# Patient Record
Sex: Female | Born: 1970 | Race: Black or African American | Hispanic: No | Marital: Married | Smoking: Never smoker
Health system: Southern US, Community
[De-identification: ages and names within clinical notes are randomized; demographics above are authoritative.]

## PROBLEM LIST (undated history)

## (undated) DIAGNOSIS — I10 Essential (primary) hypertension: Secondary | ICD-10-CM

## (undated) DIAGNOSIS — E7800 Pure hypercholesterolemia, unspecified: Secondary | ICD-10-CM

## (undated) HISTORY — PX: TOTAL ABDOMINAL HYSTERECTOMY: SHX209

## (undated) HISTORY — PX: ABDOMINAL HYSTERECTOMY: SHX81

---

## 2009-09-06 ENCOUNTER — Other Ambulatory Visit: Admission: RE | Admit: 2009-09-06 | Discharge: 2009-09-06 | Payer: Self-pay | Admitting: Obstetrics and Gynecology

## 2009-09-06 ENCOUNTER — Ambulatory Visit: Payer: Self-pay | Admitting: Obstetrics and Gynecology

## 2009-09-06 LAB — CONVERTED CEMR LAB
HCT: 27.8 % — ABNORMAL LOW
Hemoglobin: 8.2 g/dL — ABNORMAL LOW
MCHC: 29.5 g/dL — ABNORMAL LOW
MCV: 73.5 fL — ABNORMAL LOW
Pap Smear: NEGATIVE
Platelets: 361 K/uL
RBC: 3.78 M/uL — ABNORMAL LOW
RDW: 15.5 %
WBC: 4.9 10*3/microliter

## 2009-09-28 ENCOUNTER — Ambulatory Visit: Payer: Self-pay | Admitting: Obstetrics and Gynecology

## 2009-11-10 ENCOUNTER — Inpatient Hospital Stay (HOSPITAL_COMMUNITY): Admission: AD | Admit: 2009-11-10 | Discharge: 2009-11-10 | Payer: Self-pay | Admitting: Obstetrics & Gynecology

## 2009-11-10 ENCOUNTER — Ambulatory Visit: Payer: Self-pay | Admitting: Gynecology

## 2009-11-17 ENCOUNTER — Ambulatory Visit: Payer: Self-pay | Admitting: Obstetrics & Gynecology

## 2010-01-08 ENCOUNTER — Encounter: Payer: Self-pay | Admitting: Obstetrics & Gynecology

## 2010-01-08 ENCOUNTER — Ambulatory Visit: Payer: Self-pay | Admitting: Obstetrics & Gynecology

## 2010-01-08 ENCOUNTER — Inpatient Hospital Stay (HOSPITAL_COMMUNITY): Admission: RE | Admit: 2010-01-08 | Discharge: 2010-01-10 | Payer: Self-pay | Admitting: Obstetrics & Gynecology

## 2010-02-08 ENCOUNTER — Ambulatory Visit: Payer: Self-pay | Admitting: Obstetrics & Gynecology

## 2010-03-15 ENCOUNTER — Ambulatory Visit (HOSPITAL_COMMUNITY): Admission: RE | Admit: 2010-03-15 | Discharge: 2010-03-15 | Payer: Self-pay | Admitting: Family Medicine

## 2010-06-10 ENCOUNTER — Encounter: Payer: Self-pay | Admitting: *Deleted

## 2010-08-03 LAB — CBC
HCT: 24.5 % — ABNORMAL LOW (ref 36.0–46.0)
HCT: 29.9 % — ABNORMAL LOW (ref 36.0–46.0)
Hemoglobin: 7.8 g/dL — ABNORMAL LOW (ref 12.0–15.0)
MCHC: 30.3 g/dL (ref 30.0–36.0)
MCV: 68.9 fL — ABNORMAL LOW (ref 78.0–100.0)
Platelets: 324 10*3/uL (ref 150–400)
RBC: 3.53 MIL/uL — ABNORMAL LOW (ref 3.87–5.11)
RDW: 19.4 % — ABNORMAL HIGH (ref 11.5–15.5)
WBC: 5.5 10*3/uL (ref 4.0–10.5)

## 2010-08-03 LAB — ABO/RH: ABO/RH(D): O POS

## 2010-08-03 LAB — TYPE AND SCREEN: ABO/RH(D): O POS

## 2010-08-03 LAB — PREGNANCY, URINE: Preg Test, Ur: NEGATIVE

## 2010-08-05 LAB — CBC
HCT: 24.7 % — ABNORMAL LOW (ref 36.0–46.0)
Hemoglobin: 7.7 g/dL — ABNORMAL LOW (ref 12.0–15.0)
MCH: 21.9 pg — ABNORMAL LOW (ref 26.0–34.0)
MCV: 69.9 fL — ABNORMAL LOW (ref 78.0–100.0)
RBC: 3.53 MIL/uL — ABNORMAL LOW (ref 3.87–5.11)
WBC: 7 10*3/uL (ref 4.0–10.5)

## 2017-12-03 DIAGNOSIS — L659 Nonscarring hair loss, unspecified: Secondary | ICD-10-CM | POA: Insufficient documentation

## 2017-12-03 DIAGNOSIS — I10 Essential (primary) hypertension: Secondary | ICD-10-CM | POA: Insufficient documentation

## 2018-08-25 ENCOUNTER — Emergency Department (HOSPITAL_BASED_OUTPATIENT_CLINIC_OR_DEPARTMENT_OTHER): Payer: Self-pay

## 2018-08-25 ENCOUNTER — Other Ambulatory Visit: Payer: Self-pay

## 2018-08-25 ENCOUNTER — Encounter (HOSPITAL_BASED_OUTPATIENT_CLINIC_OR_DEPARTMENT_OTHER): Payer: Self-pay | Admitting: *Deleted

## 2018-08-25 ENCOUNTER — Emergency Department (HOSPITAL_BASED_OUTPATIENT_CLINIC_OR_DEPARTMENT_OTHER)
Admission: EM | Admit: 2018-08-25 | Discharge: 2018-08-25 | Disposition: A | Discharge status: Home / Self Care | Payer: Self-pay | Attending: Emergency Medicine | Admitting: Emergency Medicine

## 2018-08-25 DIAGNOSIS — Z79899 Other long term (current) drug therapy: Secondary | ICD-10-CM | POA: Insufficient documentation

## 2018-08-25 DIAGNOSIS — R05 Cough: Secondary | ICD-10-CM | POA: Insufficient documentation

## 2018-08-25 DIAGNOSIS — R110 Nausea: Secondary | ICD-10-CM

## 2018-08-25 DIAGNOSIS — I10 Essential (primary) hypertension: Secondary | ICD-10-CM | POA: Insufficient documentation

## 2018-08-25 DIAGNOSIS — R0602 Shortness of breath: Secondary | ICD-10-CM

## 2018-08-25 HISTORY — DX: Essential (primary) hypertension: I10

## 2018-08-25 LAB — TROPONIN I: Troponin I: <0.03 ng/mL (ref <0.03)

## 2018-08-25 LAB — CBC WITH DIFFERENTIAL/PLATELET
Abs Immature Granulocytes: 0.01 10*3/uL (ref 0.00–0.07)
Basophils Absolute: 0 10*3/uL (ref 0.0–0.1)
Basophils Relative: 0 %
Eosinophils Absolute: 0 10*3/uL (ref 0.0–0.5)
Eosinophils Relative: 1 %
HCT: 42.2 % (ref 36.0–46.0)
Hemoglobin: 13.7 g/dL (ref 12.0–15.0)
Immature Granulocytes: 0 %
Lymphocytes Relative: 40 %
Lymphs Abs: 1.6 10*3/uL (ref 0.7–4.0)
MCH: 28.1 pg (ref 26.0–34.0)
MCHC: 32.5 g/dL (ref 30.0–36.0)
MCV: 86.7 fL (ref 80.0–100.0)
Monocytes Absolute: 0.6 10*3/uL (ref 0.1–1.0)
Monocytes Relative: 14 %
Neutro Abs: 1.8 10*3/uL (ref 1.7–7.7)
Neutrophils Relative %: 45 %
Platelets: 208 10*3/uL (ref 150–400)
RBC: 4.87 MIL/uL (ref 3.87–5.11)
RDW: 12.9 % (ref 11.5–15.5)
WBC: 4 10*3/uL (ref 4.0–10.5)
nRBC: 0 % (ref 0.0–0.2)

## 2018-08-25 LAB — URINALYSIS, ROUTINE W REFLEX MICROSCOPIC
Bilirubin Urine: NEGATIVE
Glucose, UA: NEGATIVE mg/dL
Ketones, ur: NEGATIVE mg/dL
Leukocytes,Ua: NEGATIVE
Nitrite: NEGATIVE
Protein, ur: NEGATIVE mg/dL
Specific Gravity, Urine: 1.025 (ref 1.005–1.030)
pH: 5.5 (ref 5.0–8.0)

## 2018-08-25 LAB — COMPREHENSIVE METABOLIC PANEL
ALT: 22 U/L (ref 0–44)
AST: 21 U/L (ref 15–41)
Albumin: 3.7 g/dL (ref 3.5–5.0)
Alkaline Phosphatase: 72 U/L (ref 38–126)
Anion gap: 7 (ref 5–15)
BUN: 16 mg/dL (ref 6–20)
CO2: 30 mmol/L (ref 22–32)
Calcium: 9.4 mg/dL (ref 8.9–10.3)
Chloride: 100 mmol/L (ref 98–111)
Creatinine, Ser: 0.83 mg/dL (ref 0.44–1.00)
GFR calc Af Amer: >60 mL/min (ref >60)
GFR calc non Af Amer: >60 mL/min (ref >60)
Glucose, Bld: 88 mg/dL (ref 70–99)
Potassium: 3.3 mmol/L — ABNORMAL LOW (ref 3.5–5.1)
Sodium: 137 mmol/L (ref 135–145)
Total Bilirubin: 0.6 mg/dL (ref 0.3–1.2)
Total Protein: 6.9 g/dL (ref 6.5–8.1)

## 2018-08-25 LAB — URINALYSIS, MICROSCOPIC (REFLEX)

## 2018-08-25 LAB — LIPASE, BLOOD: Lipase: 32 U/L (ref 11–51)

## 2018-08-25 LAB — D-DIMER, QUANTITATIVE: D-Dimer, Quant: 0.35 ug/mL-FEU (ref 0.00–0.50)

## 2018-08-25 LAB — TSH: TSH: 1.981 u[IU]/mL (ref 0.350–4.500)

## 2018-08-25 MED ORDER — ONDANSETRON 4 MG PO TBDP: 4.0000 mg | ORAL_TABLET | Freq: Three times a day (TID) | ORAL | 0 refills | Status: DC | PRN

## 2018-08-25 MED ORDER — ALBUTEROL SULFATE HFA 108 (90 BASE) MCG/ACT IN AERS: 1.0000 | INHALATION_SPRAY | Freq: Four times a day (QID) | RESPIRATORY_TRACT | 0 refills | Status: DC | PRN

## 2018-08-25 NOTE — ED Notes (Signed)
Pt. To radiology at this time

## 2018-08-25 NOTE — ED Provider Notes (Signed)
[facility] EMERGENCY DEPARTMENT Provider Note   CSN: [id number] Arrival date & time: 08/25/18  1645  History   Chief Complaint Shortness or breath, Nausea  HPI Beverly Valentine is a 48 y.o. female with past medical history significant for hypertension who presents for evaluation of multiple complaints.   Patient states that she has had intermittent SOB over the last 4 days. States symptoms are not exertional in nature. Last episode 3 hours PTA while she was sitting on the cough. Denies associated chest pain, diaphoresis, lightheadedness, dizziness, jaw or arm pain. States she has had a mild cough over the last 3 days however is unsure if her shortness of breath is related to her coughing.  Cough nonproductive.  She denies any congestion, rhinorrhea, fever, chills, emesis.  Not anything for symptoms.  She denies history of asthma or COPD.  Patient states she is concerned that her blood pressure may be high as she had similar symptoms when she was diagnosed with hypertension.  She is currently on 3 medications for her hypertension.  She takes these faithfully and has not missed any doses.  She denies current headache, lower extremities swelling, hx DVT, PE, recent immobilization, recent surgery. No lower extremity edema, erythema or warmth. No calf tenderness.  Patient states she is also had intermittent nausea.  States this occurs in the morning.  States normally after she eats something in the morning her nausea will dissipate throughout the day.  She has not had any episodes of emesis.  She denies history of reflux symptoms.  She has no abdominal pain.  She has had normal bowel movements.  She denies right upper quadrant or epigastric abdominal pain.  Has not take anything PTA for symptoms.  Tolerating p.o. intake at home without difficulty. No dydruria, diarrhea, constipation, abdominal pain   Denies recent travel or known exposure to coronavirus positive patients.  History obtained from  patient.  No interpreter was used.     HPI  Past Medical History:  Diagnosis Date  . Hypertension     There are no active problems to display for this patient.   History reviewed. No pertinent surgical history.   OB History   No obstetric history on file.      Home Medications    Prior to Admission medications   Medication Sig Start Date End Date Taking? Authorizing Provider  amLODipine (NORVASC) 5 MG tablet TAKE 1 TABLET BY MOUTH ONCE DAILY TITRATING  TO  2  TABLETS  DAILY  AS  DIRECTED  FOR  BLOOD  PRESSURE 07/14/18  Yes [provider]  lisinopril-hydrochlorothiazide (PRINZIDE,ZESTORETIC) 20-12.5 MG tablet TAKE 1 TABLET BY MOUTH TWICE DAILY 07/14/18  Yes [provider]  metoprolol tartrate (LOPRESSOR) 50 MG tablet TAKE 1 TABLET BY MOUTH TWICE A DAY 12/03/17  Yes [provider]  albuterol (PROVENTIL HFA;VENTOLIN HFA) 108 (90 Base) MCG/ACT inhaler Inhale 1-2 puffs into the lungs every 6 (six) hours as needed for wheezing or shortness of breath. 08/25/18   Henderly, Britni A, PA-C  ondansetron (ZOFRAN ODT) 4 MG disintegrating tablet Take 1 tablet (4 mg total) by mouth every 8 (eight) hours as needed for nausea or vomiting. 08/25/18   Henderly, Britni A, PA-C    Family History No family history on file.  Social History Social History   Tobacco Use  . Smoking status: Never Smoker  . Smokeless tobacco: Never Used  Substance Use Topics  . Alcohol use: Never    Frequency: Never  . Drug  use: Never     Allergies   Patient has no known allergies.   Review of Systems Review of Systems  Constitutional: Negative.   HENT: Negative.   Respiratory: Positive for cough and shortness of breath. Negative for apnea, choking, chest tightness, wheezing and stridor.   Cardiovascular: Negative.   Gastrointestinal: Positive for nausea. Negative for abdominal distention, abdominal pain, anal bleeding, blood in stool, constipation, diarrhea, rectal pain and  vomiting.  Genitourinary: Negative.   Musculoskeletal: Negative.   Skin: Negative.   Neurological: Negative.   All other systems reviewed and are negative.  Physical Exam Updated Vital Signs BP 134/77   Pulse 69   Temp 97.9 F (36.6 C) (Oral)   Resp 20   Ht 5\' 6"  (1.676 m)   Wt 86.2 kg   SpO2 99%   BMI 30.67 kg/m   Physical Exam Vitals signs and nursing note reviewed.  Constitutional:      General: She is not in acute distress.    Appearance: She is well-developed. She is not ill-appearing, toxic-appearing or diaphoretic.     Comments: Walking around room talking on phone on initial evaluation.  No acute distress noted.  HENT:     Head: Normocephalic and atraumatic.     Jaw: There is normal jaw occlusion.     Nose: Nose normal.     Right Sinus: No maxillary sinus tenderness or frontal sinus tenderness.     Left Sinus: No maxillary sinus tenderness or frontal sinus tenderness.     Comments: No congestion or rhinorrhea noted.    Mouth/Throat:     Comments: Posterior oropharynx clear.  Mucous membranes moist.  Tonsils without erythema or exudate.  Uvula midline without deviation.  No drooling, dysphasia or trismus. Eyes:     Pupils: Pupils are equal, round, and reactive to light.     Comments: Conjunctiva without pallor.  Neck:     Musculoskeletal: Normal range of motion.     Comments: No neck stiffness or neck rigidity.  No meningismus.  Phonation normal.  No cervical lymphadenopathy. Cardiovascular:     Rate and Rhythm: Normal rate.     Pulses: Normal pulses.     Heart sounds: Normal heart sounds.     Comments: No murmurs, rubs or gallops. Pulmonary:     Effort: No respiratory distress.     Comments: Clear to ascultation without wheeze, rhonchi, rales or stridor. Able to speak in full sentences without difficulty. No assessory muscle usage. Chest:     Comments: No chest wall TTP. No crepitus or obvious deformity noted. Abdominal:     General: There is no distension.      Comments: Soft, without rebound or guarding. Normoactive bowel sounds. No abdominal wall herniation or masses. No abdominal wall skin changes. No CVA tenderness.  Musculoskeletal: Normal range of motion.     Comments: Moves all 4 extremities without difficulty. No lower extremity edema, erythema, warmth or tenderness.  Skin:    General: Skin is warm and dry.     Comments: No rashes or lesions. Brisk cap refill.  Neurological:     Mental Status: She is alert.      ED Treatments / Results  Labs (all labs ordered are listed, but only abnormal results are displayed) Labs Reviewed  COMPREHENSIVE METABOLIC PANEL - Abnormal; Notable for the following components:      Result Value   Potassium 3.3 (*)    All other components within normal limits  URINALYSIS, ROUTINE W REFLEX MICROSCOPIC -  Abnormal; Notable for the following components:   APPearance HAZY (*)    Hgb urine dipstick LARGE (*)    All other components within normal limits  URINALYSIS, MICROSCOPIC (REFLEX) - Abnormal; Notable for the following components:   Bacteria, UA MANY (*)    All other components within normal limits  URINE CULTURE  CBC WITH DIFFERENTIAL/PLATELET  D-DIMER, QUANTITATIVE (NOT AT [facility])  TROPONIN I  LIPASE, BLOOD  TSH    EKG EKG Interpretation  Date/Time:  Tuesday August 25 2018 17:14:56 EDT Ventricular Rate:  69 PR Interval:    QRS Duration: 91 QT Interval:  392 QTC Calculation: 420 R Axis:   28 Text Interpretation:  Sinus rhythm Probable left atrial enlargement RSR' in V1 or V2, right VCD or RVH Baseline wander in lead(s) V6 No STEMI.  Confirmed by Alona Bene [id number]) on 08/25/2018 5:19:28 PM   Radiology Dg Chest 2 View  Result Date: 08/25/2018 CLINICAL DATA:  Shortness of breath EXAM: CHEST - 2 VIEW COMPARISON:  None. FINDINGS: There is slight atelectasis in the right upper lobe. There is no edema or consolidation. Heart size and pulmonary vascularity are normal. No adenopathy. No bone  lesions. IMPRESSION: Slight right upper lobe atelectasis. No edema or consolidation. No adenopathy. Electronically Signed   By: Bretta Bang III M.D.   On: 08/25/2018 17:57    Procedures Procedures (including critical care time)  Medications Ordered in ED Medications - No data to display  Initial Impression / Assessment and Plan / ED Course  I have reviewed the triage vital signs and the nursing notes.  Pertinent labs & imaging results that were available during my care of the patient were reviewed by me and considered in my medical decision making (see chart for details).  48 year old female appears otherwise well presents for evaluation of nausea and shortness of breath.  States symptoms do not occur at the same time.  Patient states over the past 2 weeks she has had intermittent nausea when she wakes up in the morning.  States after she eats her symptoms resolve.  Abdomen soft, nontender without rebound or guarding.  She has not any episodes of emesis.  Normal bowel movement and normal urination.  Patient with history of hysterectomy, no chance of pregnancy.  Mucus membranes moist.  No signs of dehydration on exam.  She is tolerating p.o. intake at home without difficulty.  Posterior oropharynx clear.  Has also had intermittent shortness of breath.  Nonexertional.  Denies chest pain, hemoptysis, associated lightheadedness, dizziness, arm or jaw pain.  She does have history of hypertension.  Denies history of hyperlipidemia, diabetes, family history of early MI.  Heart score 2- Age, HTN, PERC and Wells criteria negative.  Denies history of asthma or COPD.  Intermittent in nature she does not have any current shortness of breath.  She has had mild nonproductive cough.  No congestion, rhinorrhea, fever.  No recent travel or known contacts with coronavirus positive patients.  CBC without leukocytosis, metabolic panel mild hypokalemia 3.3, likely due to her blood pressure medications, d-dimer  negative, lipase 32, troponin negative (given sx for 4 days do not feel patient needs deltra troponin at this time), urinalysis with many bacteria, nitrate, leukocyte negative.  Patient is not any urinary symptoms.  Will culture and hold on antibiotics until culture returns.  Discussed this with patient.  EKG without ST/T changes.  No STEMI.  Chest x-ray with slight atelectasis in right upper lobe.  No tachypnea, tachycardia or hypoxia, low suspicion  for PE, dissection, atypical chest pain as cause of her shortness of breath.  She does not look in fluid overload.  She has had mild nonproductive cough with her intermittent shortness of breath.  Discussed with patient that we cannot rule out coronavirus at this time.  She does not meet CDC or [place] criteria to be tested. Ambulates with oxygen saturation >98% on RA.  Recommend with patient, home isolation.  Will DC home with symptomatic management. Patient is to be discharged with recommendation to follow up with PCP in regards to today's hospital visit. SOB is not likely of cardiac or pulmonary etiology d/t presentation, PERC negative, VSS, no tracheal deviation, no JVD or new murmur, RRR, breath sounds equal bilaterally, EKG without acute abnormalities, negative troponin, and negative CXR. Pt has been advised to return to the ED if SOB becomes exertional, associated with diaphoresis or nausea, radiates to left jaw/arm, worsens or becomes concerning in any way. TSH pending. Will FU with PCP for results, per patient.  On reevaluation abdomen soft without rebound or guarding. Patient is nontoxic, nonseptic appearing, in no apparent distress. Patient does not meet the SIRS or Sepsis criteria.  On repeat exam patient does not have a surgical abdomin and there are no peritoneal signs.  No indication of appendicitis, bowel obstruction, bowel perforation, cholecystitis, diverticulitis, PID or ectopic pregnancy. No episodes of nausea or emesis in department. Patient  discharged home with symptomatic treatment and given strict instructions for follow-up with their primary care physician.   DDX: Broad DDX--Atypical CP, PE, pulmonary edema, dissection, pneumothorax, pneumonia, viral URI, pregnancy, hernia, obstruction     Beverly Valentine was evaluated in Emergency Department on 08/25/2018 for the symptoms described in the history of present illness. She was evaluated in the context of the global COVID-19 pandemic, which necessitated consideration that the patient might be at risk for infection with the SARS-CoV-2 virus that causes COVID-19. Institutional protocols and algorithms that pertain to the evaluation of patients at risk for COVID-19 are in a state of rapid change based on information released by regulatory bodies including the CDC and federal and state organizations. These policies and algorithms were followed during the patient's care in the ED. Final Clinical Impressions(s) / ED Diagnoses   Final diagnoses:  Nausea  SOB (shortness of breath)    ED Discharge Orders         Ordered    albuterol (PROVENTIL HFA;VENTOLIN HFA) 108 (90 Base) MCG/ACT inhaler  Every 6 hours PRN     08/25/18 1828    ondansetron (ZOFRAN ODT) 4 MG disintegrating tablet  Every 8 hours PRN     08/25/18 1828           Henderly, Britni A, PA-C 08/25/18 1842    Long, Arlyss Repress, MD 08/25/18 2222

## 2018-08-25 NOTE — Discharge Instructions (Signed)
You were evaluated today for nausea as well as shortness of breath.  I have given you a prescription for Zofran to help with your nausea.  Please take as prescribed.  Follow-up with your primary care provider for reevaluation of your nausea if this continues.  You were also evaluated for shortness of breath.  Your EKG and lab work was reassuring.  I have low suspicion for a blood clot or a heart attack causing her symptoms at this time.  Given we do have a global pandemic of coronavirus this is also possibly a cause that we cannot rule out at this time given you did not meet CDC criteria to be tested.  I would recommend home isolation for 7 days from the first day of symptom onset.  You have worsening symptoms, develop chest pain, lightheaded dizziness or radiation of pain with sweating please seek emergency treatment.  Written a prescription for an albuterol inhaler.  You may use this when he feels short of breath.  Return to the ED for any worsening symptoms

## 2018-08-25 NOTE — ED Notes (Signed)
Pt on monitor 

## 2018-08-25 NOTE — ED Notes (Signed)
Pt. Reports she has had some shortness of breath off and on due to her blood pressure med.

## 2018-08-25 NOTE — ED Triage Notes (Signed)
Nausea and SOB x 3 days.

## 2018-08-27 LAB — URINE CULTURE: Culture: NO GROWTH

## 2020-02-17 DIAGNOSIS — E7800 Pure hypercholesterolemia, unspecified: Secondary | ICD-10-CM | POA: Insufficient documentation

## 2020-09-11 NOTE — Progress Notes (Signed)
New Patient Office Visit  Subjective:  Patient ID: Beverly Valentine, female    DOB: 1970/10/30  Age: 50 y.o. MRN: [id number]  CC:  Chief Complaint  Patient presents with  . Establish Care  . Hypertension    HPI Beverly Valentine presents to establish care.  Has plans to start a healthy lifestyle regiment including dietary modifications as well as exercise.  Is very concerned with losing weight. Hopes that this may help her get off some of her blood pressure medications since she is taking several.  She does have labs snoring but has not been tested for sleep apnea.  Would like to hold off on this for now but if she would like to do so she will let me know.  HTN-has been treated for several years for hypertension.  Notes that when she first started antihypertensives, her blood pressure was greater than 200 systolically.  Has been prescribed several medications to help manage her blood pressure.  Notes that she was taking amlodipine 5 mg twice daily but has been out of this medication for about 1 week.  Also taking metoprolol tartrate 50 mg twice daily.  Is prescribed to take clonidine 0.1 mg twice daily but only takes this at night because it makes her sleepy.  Patient reports taking lisinopril with HCTZ however this is reflected in her chart with lisinopril by itself.  She is not sure which 1 is correct.  She does check her blood pressures at home and notes that the readings have been well managed despite being out of amlodipine over the last week.  She did have a little bit of shortness of breath last week but this has resolved.  Denies chest pain, palpitations, vision changes, and unusual headaches.  Does note that she had some lower extremity swelling when taking amlodipine and would like to avoid having to go back on that at all possible.  High cholesterol-was told that her cholesterol was somewhat elevated and was started on rosuvastatin 5 mg daily.  She has been taking this as prescribed, tolerating  well without side effects.  Past Medical History:  Diagnosis Date  . Hypertension     Past Surgical History:  Procedure Laterality Date  . CESAREAN SECTION      Family History  Problem Relation Age of Onset  . Hypertension Other     Social History   Socioeconomic History  . Marital status: Married    Spouse name: Not on file  . Number of children: Not on file  . Years of education: Not on file  . Highest education level: Not on file  Occupational History  . Not on file  Tobacco Use  . Smoking status: Never Smoker  . Smokeless tobacco: Never Used  Substance and Sexual Activity  . Alcohol use: Never  . Drug use: Never  . Sexual activity: Yes    Birth control/protection: Surgical  Other Topics Concern  . Not on file  Social History Narrative  . Not on file   Social Determinants of Health   Financial Resource Strain: Not on file  Food Insecurity: Not on file  Transportation Needs: Not on file  Physical Activity: Not on file  Stress: Not on file  Social Connections: Not on file  Intimate Partner Violence: Not on file    ROS Review of Systems  Constitutional: Negative for chills, fatigue, fever and unexpected weight change.  Eyes: Negative for visual disturbance.  Respiratory: Positive for shortness of breath (Last week, resolved). Negative for cough,  chest tightness and wheezing.   Cardiovascular: Positive for leg swelling (When taking amlodipine). Negative for chest pain and palpitations.  Neurological: Positive for headaches (Intermittent, infrequent). Negative for dizziness and light-headedness.  Psychiatric/Behavioral: Positive for sleep disturbance. Negative for dysphoric mood, self-injury and suicidal ideas. The patient is not nervous/anxious.     Objective:   Today's Vitals: BP 136/79   Pulse 92   Ht 5\' 7"  (1.702 m)   Wt 225 lb 6.4 oz (102.2 kg)   SpO2 98%   BMI 35.30 kg/m   Physical Exam Vitals reviewed.  Constitutional:      General: She  is not in acute distress.    Appearance: Normal appearance.  HENT:     Head: Normocephalic and atraumatic.  Cardiovascular:     Rate and Rhythm: Normal rate and regular rhythm.     Pulses: Normal pulses.     Heart sounds: Normal heart sounds. No murmur heard. No friction rub. No gallop.   Pulmonary:     Effort: Pulmonary effort is normal. No respiratory distress.     Breath sounds: Normal breath sounds. No wheezing.  Skin:    General: Skin is warm and dry.  Neurological:     Mental Status: She is alert and oriented to person, place, and time.  Psychiatric:        Mood and Affect: Mood normal.        Behavior: Behavior normal.        Thought Content: Thought content normal.        Judgment: Judgment normal.    Assessment & Plan:   1. Encounter to establish care Reviewed available information and discussed healthcare concerns with patient.  2. Hypercholesterolemia Checking lipid panel today.  Continue rosuvastatin 5 mg daily. - Lipid panel  3. Essential hypertension Checking CBC with differential, CMP.  Continue low-sodium diet but recommend going home and doing a "salt survey" to evaluate hidden sodium that she may not be aware of.  Plan to continue metoprolol 50 mg twice daily, lisinopril-HCTZ 20-12.5 mg twice daily, and clonidine 0.1 mg twice daily.  We will hold off on amlodipine for now and see what her blood pressure looks like in the couple of weeks. - CBC with Differential/Platelet - COMPLETE METABOLIC PANEL WITH GFR  4. Need for Tdap vaccination Tdap given in office today. - Tdap vaccine greater than or equal to 7yo IM  5. Encounter for screening mammogram for malignant neoplasm of breast Due for mammograms ordering today. - MM DIGITAL SCREENING BILATERAL; Future  6. Screen for colon cancer Due for colon cancer screening so referring to GI today. - Ambulatory referral to Gastroenterology  7. Screening for HIV (human immunodeficiency virus) Discussed screening  recommendations.  Patient is agreeable so we will add this to blood work today. - HIV Antibody (routine testing w rflx)  8. Weight gain Checking TSH today.  Discussed various dietary options for weight loss.  Tips and tricks information provided with AVS. - TSH   Outpatient Encounter Medications as of 09/12/2020  Medication Sig  . lisinopril (ZESTRIL) 20 MG tablet 1 p.o. twice daily for blood pressure  . amLODipine (NORVASC) 5 MG tablet Take 5 mg by mouth daily. (Patient not taking: Reported on 09/12/2020)  . cloNIDine (CATAPRES) 0.1 MG tablet Take 1 tablet by mouth 2 (two) times daily. (Patient not taking: Reported on 09/12/2020)  . metoprolol tartrate (LOPRESSOR) 50 MG tablet Take by mouth. (Patient not taking: Reported on 09/12/2020)  . rosuvastatin (CRESTOR) 5 MG  tablet Take 1 tablet by mouth daily. (Patient not taking: Reported on 09/12/2020)  . [DISCONTINUED] albuterol (PROVENTIL HFA;VENTOLIN HFA) 108 (90 Base) MCG/ACT inhaler Inhale 1-2 puffs into the lungs every 6 (six) hours as needed for wheezing or shortness of breath.  . [DISCONTINUED] amLODipine (NORVASC) 5 MG tablet TAKE 1 TABLET BY MOUTH ONCE DAILY TITRATING  TO  2  TABLETS  DAILY  AS  DIRECTED  FOR  BLOOD  PRESSURE  . [DISCONTINUED] lisinopril-hydrochlorothiazide (PRINZIDE,ZESTORETIC) 20-12.5 MG tablet TAKE 1 TABLET BY MOUTH TWICE DAILY  . [DISCONTINUED] metoprolol tartrate (LOPRESSOR) 50 MG tablet TAKE 1 TABLET BY MOUTH TWICE A DAY  . [DISCONTINUED] ondansetron (ZOFRAN ODT) 4 MG disintegrating tablet Take 1 tablet (4 mg total) by mouth every 8 (eight) hours as needed for nausea or vomiting.   No facility-administered encounter medications on file as of 09/12/2020.   Follow-up: Return in about 2 weeks (around 09/26/2020) for nurse visit for BP check.  Advised patient to bring her home blood pressure cuff so we can verify its accuracy against our machines.  Thayer Ohm, DNP, APRN, FNP-BC [facility]

## 2020-09-12 ENCOUNTER — Telehealth: Payer: Self-pay

## 2020-09-12 ENCOUNTER — Ambulatory Visit (INDEPENDENT_AMBULATORY_CARE_PROVIDER_SITE_OTHER): Payer: 59 | Admitting: Medical-Surgical

## 2020-09-12 ENCOUNTER — Other Ambulatory Visit: Payer: Self-pay

## 2020-09-12 ENCOUNTER — Encounter: Payer: Self-pay | Admitting: Medical-Surgical

## 2020-09-12 VITALS — BP 136/79 | HR 92 | Ht 67.0 in | Wt 225.4 lb

## 2020-09-12 DIAGNOSIS — I10 Essential (primary) hypertension: Secondary | ICD-10-CM

## 2020-09-12 DIAGNOSIS — Z1211 Encounter for screening for malignant neoplasm of colon: Secondary | ICD-10-CM

## 2020-09-12 DIAGNOSIS — Z1231 Encounter for screening mammogram for malignant neoplasm of breast: Secondary | ICD-10-CM

## 2020-09-12 DIAGNOSIS — Z7689 Persons encountering health services in other specified circumstances: Secondary | ICD-10-CM

## 2020-09-12 DIAGNOSIS — E7800 Pure hypercholesterolemia, unspecified: Secondary | ICD-10-CM

## 2020-09-12 DIAGNOSIS — Z23 Encounter for immunization: Secondary | ICD-10-CM

## 2020-09-12 DIAGNOSIS — R635 Abnormal weight gain: Secondary | ICD-10-CM

## 2020-09-12 DIAGNOSIS — Z114 Encounter for screening for human immunodeficiency virus [HIV]: Secondary | ICD-10-CM

## 2020-09-12 LAB — CBC WITH DIFFERENTIAL/PLATELET
Basophils Absolute: 18 cells/uL (ref 0–200)
Eosinophils Relative: 0.7 %

## 2020-09-12 LAB — LIPID PANEL: Non-HDL Cholesterol (Calc): 187 mg/dL (calc) — ABNORMAL HIGH (ref <130)

## 2020-09-12 LAB — COMPLETE METABOLIC PANEL WITH GFR: GFR, Est Non African American: 94 mL/min/{1.73_m2} (ref >=60)

## 2020-09-12 MED ORDER — LISINOPRIL-HYDROCHLOROTHIAZIDE 20-12.5 MG PO TABS: 1.0000 | ORAL_TABLET | Freq: Two times a day (BID) | ORAL | 1 refills | Status: DC

## 2020-09-12 MED ORDER — CLONIDINE HCL 0.1 MG PO TABS: 0.1000 mg | ORAL_TABLET | Freq: Two times a day (BID) | ORAL | 1 refills | Status: DC

## 2020-09-12 MED ORDER — ROSUVASTATIN CALCIUM 5 MG PO TABS: 5.0000 mg | ORAL_TABLET | Freq: Every day | ORAL | 3 refills | Status: DC

## 2020-09-12 MED ORDER — METOPROLOL TARTRATE 50 MG PO TABS: 50.0000 mg | ORAL_TABLET | Freq: Two times a day (BID) | ORAL | 1 refills | Status: DC

## 2020-09-12 NOTE — Telephone Encounter (Signed)
Per [facility], they have not filled any meds for her since 08/30/2018. They [facility] all [facility]. Called and spoke with pt who states she does not use [facility], she uses [facility] [address] in [place]. Their records show that she is taking lisinopril 20 mg and got a #90 day supply on 03/16/2020.

## 2020-09-12 NOTE — Telephone Encounter (Signed)
Since we are holding off on amlodipine, I sent prescriptions for metoprolol 50 mg twice daily, clonidine 0.1 mg twice daily, and lisinopril-HCTZ 20-12.5 mg twice daily.  At her 2-week nurse visit appointment, we will evaluate her blood pressure control on this medications before adding or taking away anything else.

## 2020-09-12 NOTE — Telephone Encounter (Signed)
-----   Message from Christen Butter, NP sent at 09/12/2020 10:55 AM EDT ----- We please call the patient's pharmacy to verify whether she is taking lisinopril or lisinopril with hydrochlorothiazide.  The chart says 1 and the paperwork says another but the patient was not sure which was correct.  Thanks, Ander Slade

## 2020-09-12 NOTE — Patient Instructions (Addendum)
Let's continue Metoprolol, Lisinopril, and Clonidine for BP control. Hold off on Amlodipine until we see what BP looks like in 2 weeks.    Weight loss tips and tricks:  1.  Make sure to drink at least 64 ounces of water every day. 2.  Avoid alcohol. 3.  Avoid eating within 3 hours of going to bed. 4.  Cut out sugary drinks such as sweet tea, regular sodas, energy drinks, etc. 5.  Make sure you are getting enough sleep every night. 6.  Start making changes by cutting back portion sizes by 1/3. 7.  Keep a food diary to help identify areas for improvement and promote awareness of bad habits. 8.  Increase your activity.  Choose something you like to do that is fun for you so you are more likely to stick with it. 9.  Do not forget your protein! 10.  Measure your neck, upper arms, waist, hips, and thighs and write those measurements down somewhere before you start your weight loss journey.  Changes in your measurements will tell a far more accurate story than the number on a scale. 11.  As you go, pay attention to how your clothes fit! 12.  Weigh yourself as often or as little as you need to.  Some folks do better weighing every day while others do better with once a week. 13.  Do not get discouraged!  Weight loss efforts are meant to be lifestyle changes.  Once you stop a medication (if you are taking 1), your behaviors and habits will determine if you maintain your weight loss or not.  Good luck on your weight loss journey!  Have faith in your self and you will reach your goals!   Tdap (Tetanus, Diphtheria, Pertussis) Vaccine: What You Need to Know 1. Why get vaccinated? Tdap vaccine can prevent tetanus, diphtheria, and pertussis. Diphtheria and pertussis spread from person to person. Tetanus enters the body through cuts or wounds.  TETANUS (T) causes painful stiffening of the muscles. Tetanus can lead to serious health problems, including being unable to open the mouth, having trouble  swallowing and breathing, or death.  DIPHTHERIA (D) can lead to difficulty breathing, heart failure, paralysis, or death.  PERTUSSIS (aP), also known as "whooping cough," can cause uncontrollable, violent coughing that makes it hard to breathe, eat, or drink. Pertussis can be extremely serious especially in babies and young children, causing pneumonia, convulsions, brain damage, or death. In teens and adults, it can cause weight loss, loss of bladder control, passing out, and rib fractures from severe coughing. 2. Tdap vaccine Tdap is only for children 7 years and older, adolescents, and adults.  Adolescents should receive a single dose of Tdap, preferably at age 56 or 12 years. Pregnant people should get a dose of Tdap during every pregnancy, preferably during the early part of the third trimester, to help protect the newborn from pertussis. Infants are most at risk for severe, life-threatening complications from pertussis. Adults who have never received Tdap should get a dose of Tdap. Also, adults should receive a booster dose of either Tdap or Td (a different vaccine that protects against tetanus and diphtheria but not pertussis) every 10 years, or after 5 years in the case of a severe or dirty wound or burn. Tdap may be given at the same time as other vaccines. 3. Talk with your health care provider Tell your vaccine provider if the person getting the vaccine:  Has had an allergic reaction after a previous dose  of any vaccine that protects against tetanus, diphtheria, or pertussis, or has any severe, life-threatening allergies  Has had a coma, decreased level of consciousness, or prolonged seizures within 7 days after a previous dose of any pertussis vaccine (DTP, DTaP, or Tdap)  Has seizures or another nervous system problem  Has ever had Guillain-Barr Syndrome (also called "GBS")  Has had severe pain or swelling after a previous dose of any vaccine that protects against tetanus or  diphtheria In some cases, your health care provider may decide to postpone Tdap vaccination until a future visit. People with minor illnesses, such as a cold, may be vaccinated. People who are moderately or severely ill should usually wait until they recover before getting Tdap vaccine.  Your health care provider can give you more information. 4. Risks of a vaccine reaction  Pain, redness, or swelling where the shot was given, mild fever, headache, feeling tired, and nausea, vomiting, diarrhea, or stomachache sometimes happen after Tdap vaccination. People sometimes faint after medical procedures, including vaccination. Tell your provider if you feel dizzy or have vision changes or ringing in the ears.  As with any medicine, there is a very remote chance of a vaccine causing a severe allergic reaction, other serious injury, or death. 5. What if there is a serious problem? An allergic reaction could occur after the vaccinated person leaves the clinic. If you see signs of a severe allergic reaction (hives, swelling of the face and throat, difficulty breathing, a fast heartbeat, dizziness, or weakness), call 9-1-1 and get the person to the nearest hospital. For other signs that concern you, call your health care provider.  Adverse reactions should be reported to the Vaccine Adverse Event Reporting System (VAERS). Your health care provider will usually file this report, or you can do it yourself. Visit the VAERS website at www.vaers.LAgents.no or call 515 708 6130. VAERS is only for reporting reactions, and VAERS staff members do not give medical advice. 6. The National Vaccine Injury Compensation Program The [employer] Vaccine Injury Compensation Program (VICP) is a federal program that was created to compensate people who may have been injured by certain vaccines. Claims regarding alleged injury or death due to vaccination have a time limit for filing, which may be as short as two years. Visit the VICP  website at SpiritualWord.at or call 865 369 4738 to learn about the program and about filing a claim. 7. How can I learn more?  Ask your health care provider.  Call your local or state health department.  Visit the website of the Food and Drug Administration (FDA) for vaccine package inserts and additional information at FinderList.no.  Contact the Centers for Disease Control and Prevention (CDC): ? Call 551 864 2755 (1-800-CDC-INFO) or ? Visit CDC's website at PicCapture.uy. Vaccine Information Statement Tdap (Tetanus, Diphtheria, Pertussis) Vaccine (12/24/2019) This information is not intended to replace advice given to you by your health care provider. Make sure you discuss any questions you have with your health care provider. Document Revised: 01/19/2020 Document Reviewed: 01/19/2020 Elsevier Patient Education  2021 [employer].

## 2020-09-13 LAB — CBC WITH DIFFERENTIAL/PLATELET
Absolute Monocytes: 482 cells/uL (ref 200–950)
Basophils Relative: 0.4 %
Eosinophils Absolute: 32 cells/uL (ref 15–500)
HCT: 43.9 % (ref 35.0–45.0)
Hemoglobin: 14.4 g/dL (ref 11.7–15.5)
Lymphs Abs: 1805 cells/uL (ref 850–3900)
MCH: 27.8 pg (ref 27.0–33.0)
MCHC: 32.8 g/dL (ref 32.0–36.0)
MCV: 84.7 fL (ref 80.0–100.0)
MPV: 10.4 fL (ref 7.5–12.5)
Monocytes Relative: 10.7 %
Neutro Abs: 2165 cells/uL (ref 1500–7800)
Neutrophils Relative %: 48.1 %
Platelets: 278 10*3/uL (ref 140–400)
RBC: 5.18 10*6/uL — ABNORMAL HIGH (ref 3.80–5.10)
RDW: 13.1 % (ref 11.0–15.0)
Total Lymphocyte: 40.1 %
WBC: 4.5 10*3/uL (ref 3.8–10.8)

## 2020-09-13 LAB — COMPLETE METABOLIC PANEL WITH GFR
AG Ratio: 1.6 (calc) (ref 1.0–2.5)
ALT: 18 U/L (ref 6–29)
AST: 17 U/L (ref 10–35)
Albumin: 4.5 g/dL (ref 3.6–5.1)
Alkaline phosphatase (APISO): 96 U/L (ref 37–153)
BUN: 13 mg/dL (ref 7–25)
CO2: 28 mmol/L (ref 20–32)
Calcium: 10.7 mg/dL — ABNORMAL HIGH (ref 8.6–10.4)
Chloride: 104 mmol/L (ref 98–110)
Creat: 0.74 mg/dL (ref 0.50–1.05)
GFR, Est African American: 109 mL/min/{1.73_m2} (ref >=60)
Globulin: 2.9 g/dL (calc) (ref 1.9–3.7)
Glucose, Bld: 81 mg/dL (ref 65–139)
Potassium: 4.1 mmol/L (ref 3.5–5.3)
Sodium: 140 mmol/L (ref 135–146)
Total Bilirubin: 0.3 mg/dL (ref 0.2–1.2)
Total Protein: 7.4 g/dL (ref 6.1–8.1)

## 2020-09-13 LAB — LIPID PANEL
Cholesterol: 252 mg/dL — ABNORMAL HIGH (ref <200)
HDL: 65 mg/dL (ref >=50)
LDL Cholesterol (Calc): 165 mg/dL (calc) — ABNORMAL HIGH
Total CHOL/HDL Ratio: 3.9 (calc) (ref <5.0)
Triglycerides: 103 mg/dL (ref <150)

## 2020-09-13 LAB — TSH: TSH: 1.23 mIU/L

## 2020-09-13 LAB — HIV ANTIBODY (ROUTINE TESTING W REFLEX): HIV 1&2 Ab, 4th Generation: NONREACTIVE

## 2020-09-13 MED ORDER — ROSUVASTATIN CALCIUM 10 MG PO TABS: 10.0000 mg | ORAL_TABLET | Freq: Every day | ORAL | 3 refills | Status: DC

## 2020-09-13 NOTE — Addendum Note (Signed)
Addended byChristen Butter on: 09/13/2020 11:47 AM   Modules accepted: Orders

## 2020-09-13 NOTE — Telephone Encounter (Signed)
Pt aware Rx's have been sent to the pharmacy. No further questions or concerns at this time.

## 2020-09-26 ENCOUNTER — Ambulatory Visit: Payer: 59

## 2020-10-02 ENCOUNTER — Ambulatory Visit: Payer: Self-pay

## 2020-11-28 ENCOUNTER — Telehealth: Payer: Self-pay | Admitting: *Deleted

## 2020-11-28 NOTE — Telephone Encounter (Signed)
Attempted to reach pt= no answer and voicemail not set up.  NOS letter sent and colonoscopy cancelled

## 2020-11-28 NOTE — Telephone Encounter (Signed)
Pt is a NOS to [facility].  1540- attempted to reach pt; no answer and voicemail full  1605- attempted to reach pt; no answer and voicemail full

## 2020-12-12 ENCOUNTER — Encounter: Payer: Self-pay | Admitting: Gastroenterology

## 2021-02-12 ENCOUNTER — Other Ambulatory Visit: Payer: Self-pay | Admitting: Medical-Surgical

## 2021-05-10 ENCOUNTER — Other Ambulatory Visit: Payer: Self-pay | Admitting: Medical-Surgical

## 2021-05-15 ENCOUNTER — Encounter: Payer: Self-pay | Admitting: Family Medicine

## 2021-05-15 ENCOUNTER — Ambulatory Visit (INDEPENDENT_AMBULATORY_CARE_PROVIDER_SITE_OTHER): Payer: Self-pay | Admitting: Family Medicine

## 2021-05-15 ENCOUNTER — Other Ambulatory Visit: Payer: Self-pay

## 2021-05-15 VITALS — BP 137/84 | HR 79 | Temp 98.8°F | Ht 66.0 in | Wt 219.0 lb

## 2021-05-15 DIAGNOSIS — I10 Essential (primary) hypertension: Secondary | ICD-10-CM

## 2021-05-15 DIAGNOSIS — E7800 Pure hypercholesterolemia, unspecified: Secondary | ICD-10-CM

## 2021-05-15 MED ORDER — ROSUVASTATIN CALCIUM 10 MG PO TABS: 10.0000 mg | ORAL_TABLET | Freq: Every day | ORAL | 1 refills | Status: DC

## 2021-05-15 MED ORDER — LISINOPRIL-HYDROCHLOROTHIAZIDE 20-12.5 MG PO TABS: 1.0000 | ORAL_TABLET | Freq: Two times a day (BID) | ORAL | 1 refills | Status: DC

## 2021-05-15 MED ORDER — CLONIDINE HCL 0.1 MG PO TABS: 0.1000 mg | ORAL_TABLET | Freq: Two times a day (BID) | ORAL | 1 refills | Status: DC

## 2021-05-15 NOTE — Assessment & Plan Note (Signed)
Blood pressure is at goal for age and co-morbidities.  I recommend continue current regimen.  In addition they were instructed on the following: - BP goal <130/80 - monitor and log blood pressures at home - check around the same time each day in a relaxed setting - Limit salt to <2000 mg/day - Follow DASH eating plan (heart healthy diet) - limit alcohol to 2 standard drinks per day for men and 1 per day for women - avoid tobacco products - get at least 2 hours of regular aerobic exercise weekly States she has not been using her metoprolol regularly because she thinks it makes her feet swell. Discussed with patient. Given the BP is stable without having taken this recently, she can continue other meds as is for now. Recommend she check home reading at least once a day and let us know if remaining elevated. If high, she either needs to restart the metoprolol, or if she cannot tolerate, let us know so we can change to something else.  Patient aware of signs/symptoms requiring further/urgent evaluation. Labs updated today.

## 2021-05-15 NOTE — Patient Instructions (Addendum)
Blood pressure is at goal for age and co-morbidities.  I recommend continue current regimen.  In addition they were instructed on the following: - BP goal <130/80 - monitor and log blood pressures at home - check around the same time each day in a relaxed setting - Limit salt to <2000 mg/day - Follow DASH eating plan (heart healthy diet) - limit alcohol to 2 standard drinks per day for men and 1 per day for women - avoid tobacco products - get at least 2 hours of regular aerobic exercise weekly Patient aware of signs/symptoms requiring further/urgent evaluation. Labs updated today.   HLD PLAN: -Reviewed most recent lipid panel -Medication management: continue rosuvastatin -Repeat CMP and lipid panel today -Diet low in saturated fat -Regular exercise - at least 30 minutes, 5 times per week

## 2021-05-15 NOTE — Assessment & Plan Note (Signed)
-  Reviewed most recent lipid panel -Medication management: continue rosuvastatin -Repeat CMP and lipid panel today -Diet low in saturated fat -Regular exercise - at least 30 minutes, 5 times per week

## 2021-05-15 NOTE — Progress Notes (Signed)
Established Patient Office Visit  Subjective:  Patient ID: Beverly Valentine, female    DOB: [birth year]  Age: 50 y.o. MRN: [id number]  CC:  Chief Complaint  Patient presents with   Medication Refill    HPI Beverly Valentine presents for follow-up and med refills.   HYPERTENSION: - Medications: lisinopril-HCTZ 20-12.5 mg, metoprolol 50 mg (not regularly), clonidine 0.1 mg BID - Compliance: good - Checking BP at home: yes, occasionally up to SBP 150, but not often  - Denies any SOB, recurrent headaches, CP, vision changes, LE edema, dizziness, palpitations, or medication side effects. - Diet: well balanced, veggies, tries to avoid sodium and pork  - Exercise: minimal, walking at work      HYPERLIPIDEMIA - medications: rosuvastatin 10 mg - compliance: good - medication SEs: none The 10-year ASCVD risk score (Arnett DK, et al., 2019) is: 4.8%   Values used to calculate the score:     Age: 84 years     Sex: Female     Is Non-Hispanic African American: Yes     Diabetic: No     Tobacco smoker: No     Systolic Blood Pressure: 137 mmHg     Is BP treated: Yes     HDL Cholesterol: 65 mg/dL     Total Cholesterol: 252 mg/dL       Past Medical History:  Diagnosis Date   Hypertension     Past Surgical History:  Procedure Laterality Date   CESAREAN SECTION      Family History  Problem Relation Age of Onset   Hypertension Other     Social History   Socioeconomic History   Marital status: Married    Spouse name: Not on file   Number of children: Not on file   Years of education: Not on file   Highest education level: Not on file  Occupational History   Not on file  Tobacco Use   Smoking status: Never   Smokeless tobacco: Never  Substance and Sexual Activity   Alcohol use: Never   Drug use: Never   Sexual activity: Yes    Birth control/protection: Surgical  Other Topics Concern   Not on file  Social History Narrative   ** Merged History Encounter **        Social Determinants of Health   Financial Resource Strain: Not on file  Food Insecurity: Not on file  Transportation Needs: Not on file  Physical Activity: Not on file  Stress: Not on file  Social Connections: Not on file  Intimate Partner Violence: Not on file    Outpatient Medications Prior to Visit  Medication Sig Dispense Refill   metoprolol tartrate (LOPRESSOR) 50 MG tablet Take 1 tablet (50 mg total) by mouth 2 (two) times daily. 180 tablet 1   cloNIDine (CATAPRES) 0.1 MG tablet Take 1 tablet (0.1 mg total) by mouth 2 (two) times daily. 180 tablet 1   lisinopril-hydrochlorothiazide (ZESTORETIC) 20-12.5 MG tablet Take 1 tablet by mouth twice daily 30 tablet 0   rosuvastatin (CRESTOR) 10 MG tablet Take 1 tablet (10 mg total) by mouth daily. 90 tablet 3   No facility-administered medications prior to visit.    No Known Allergies  ROS Review of Systems All review of systems negative except what is listed in the HPI    Objective:    Physical Exam Vitals reviewed.  Constitutional:      Appearance: Normal appearance. She is obese.  Cardiovascular:     Rate and Rhythm: Normal  rate and regular rhythm.     Heart sounds: Normal heart sounds.  Pulmonary:     Effort: Pulmonary effort is normal.     Breath sounds: Normal breath sounds.  Musculoskeletal:     Right lower leg: No edema.     Left lower leg: No edema.  Skin:    Capillary Refill: Capillary refill takes less than 2 seconds.  Neurological:     General: No focal deficit present.     Mental Status: She is alert and oriented to person, place, and time. Mental status is at baseline.  Psychiatric:        Mood and Affect: Mood normal.        Behavior: Behavior normal.        Thought Content: Thought content normal.        Judgment: Judgment normal.      BP 137/84 (BP Location: Left Arm, Patient Position: Sitting, Cuff Size: Large)    Pulse 79    Temp 98.8 F (37.1 C) (Oral)    Ht 5\' 6"  (1.676 m)    Wt 219 lb  0.6 oz (99.4 kg)    SpO2 100%    BMI 35.35 kg/m  Wt Readings from Last 3 Encounters:  05/15/21 219 lb 0.6 oz (99.4 kg)  09/12/20 225 lb 6.4 oz (102.2 kg)  08/25/18 190 lb (86.2 kg)     Health Maintenance Due  Topic Date Due   PAP SMEAR-Modifier  09/06/2012   COLONOSCOPY (Pts 45-25yrs Insurance coverage will need to be confirmed)  Never done   MAMMOGRAM  05/11/2020    There are no preventive care reminders to display for this patient.  Lab Results  Component Value Date   TSH 1.23 09/12/2020   Lab Results  Component Value Date   WBC 4.5 09/12/2020   HGB 14.4 09/12/2020   HCT 43.9 09/12/2020   MCV 84.7 09/12/2020   PLT 278 09/12/2020   Lab Results  Component Value Date   NA 140 09/12/2020   K 4.1 09/12/2020   CO2 28 09/12/2020   GLUCOSE 81 09/12/2020   BUN 13 09/12/2020   CREATININE 0.74 09/12/2020   BILITOT 0.3 09/12/2020   ALKPHOS 72 08/25/2018   AST 17 09/12/2020   ALT 18 09/12/2020   PROT 7.4 09/12/2020   ALBUMIN 3.7 08/25/2018   CALCIUM 10.7 (H) 09/12/2020   ANIONGAP 7 08/25/2018   Lab Results  Component Value Date   CHOL 252 (H) 09/12/2020   Lab Results  Component Value Date   HDL 65 09/12/2020   Lab Results  Component Value Date   LDLCALC 165 (H) 09/12/2020   Lab Results  Component Value Date   TRIG 103 09/12/2020   Lab Results  Component Value Date   CHOLHDL 3.9 09/12/2020   No results found for: HGBA1C    Assessment & Plan:    Essential hypertension Blood pressure is at goal for age and co-morbidities.  I recommend continue current regimen.  In addition they were instructed on the following: - BP goal <130/80 - monitor and log blood pressures at home - check around the same time each day in a relaxed setting - Limit salt to <2000 mg/day - Follow DASH eating plan (heart healthy diet) - limit alcohol to 2 standard drinks per day for men and 1 per day for women - avoid tobacco products - get at least 2 hours of regular aerobic  exercise weekly States she has not been using her metoprolol regularly because  she thinks it makes her feet swell. Discussed with patient. Given the BP is stable without having taken this recently, she can continue other meds as is for now. Recommend she check home reading at least once a day and let us know if remaining elevated. If high, she either needs to restart the metoprolol, or if she cannot tolerate, let us know so we can change to something else.  Patient aware of signs/symptoms requiring further/urgent evaluation. Labs updated today.  Hypercholesterolemia -Reviewed most recent lipid panel -Medication management: continue rosuvastatin -Repeat CMP and lipid panel today -Diet low in saturated fat -Regular exercise - at least 30 minutes, 5 times per week      Follow-up: Return in about 6 months (around 11/13/2021) for HTN f/u.    Clayborne Dana, NP

## 2021-05-16 ENCOUNTER — Emergency Department (HOSPITAL_BASED_OUTPATIENT_CLINIC_OR_DEPARTMENT_OTHER)
Admission: EM | Admit: 2021-05-16 | Discharge: 2021-05-16 | Disposition: A | Discharge status: Home / Self Care | Payer: Self-pay | Attending: Emergency Medicine | Admitting: Emergency Medicine

## 2021-05-16 ENCOUNTER — Encounter (HOSPITAL_BASED_OUTPATIENT_CLINIC_OR_DEPARTMENT_OTHER): Payer: Self-pay | Admitting: Emergency Medicine

## 2021-05-16 DIAGNOSIS — I10 Essential (primary) hypertension: Secondary | ICD-10-CM | POA: Insufficient documentation

## 2021-05-16 DIAGNOSIS — H5789 Other specified disorders of eye and adnexa: Secondary | ICD-10-CM

## 2021-05-16 LAB — LIPID PANEL
Cholesterol: 246 mg/dL — ABNORMAL HIGH (ref <200)
HDL: 48 mg/dL — ABNORMAL LOW (ref >=50)
LDL Cholesterol (Calc): 175 mg/dL (calc) — ABNORMAL HIGH
Non-HDL Cholesterol (Calc): 198 mg/dL (calc) — ABNORMAL HIGH (ref <130)
Total CHOL/HDL Ratio: 5.1 (calc) — ABNORMAL HIGH (ref <5.0)
Triglycerides: 104 mg/dL (ref <150)

## 2021-05-16 LAB — COMPREHENSIVE METABOLIC PANEL
AG Ratio: 1.5 (calc) (ref 1.0–2.5)
ALT: 17 U/L (ref 6–29)
AST: 18 U/L (ref 10–35)
Albumin: 4.2 g/dL (ref 3.6–5.1)
Alkaline phosphatase (APISO): 82 U/L (ref 37–153)
BUN: 21 mg/dL (ref 7–25)
CO2: 32 mmol/L (ref 20–32)
Calcium: 10.8 mg/dL — ABNORMAL HIGH (ref 8.6–10.4)
Chloride: 102 mmol/L (ref 98–110)
Creat: 0.86 mg/dL (ref 0.50–1.03)
Globulin: 2.8 g/dL (calc) (ref 1.9–3.7)
Glucose, Bld: 73 mg/dL (ref 65–99)
Potassium: 3.9 mmol/L (ref 3.5–5.3)
Sodium: 140 mmol/L (ref 135–146)
Total Bilirubin: 0.3 mg/dL (ref 0.2–1.2)
Total Protein: 7 g/dL (ref 6.1–8.1)

## 2021-05-16 MED ORDER — POLYMYXIN B-TRIMETHOPRIM 10000-0.1 UNIT/ML-% OP SOLN: 1.0000 [drp] | OPHTHALMIC | 0 refills | Status: AC

## 2021-05-16 NOTE — ED Notes (Signed)
Patient discharged to home.  All discharge instructions reviewed.  Patient verbalized understanding via teachback method.  VS WDL.  Respirations even and unlabored.  Ambulatory out of ED.   °

## 2021-05-16 NOTE — Discharge Instructions (Signed)
Developing some eye redness and irritation.  I will start you on some antibiotic drops to use as directed for the next week.  Please keep your hands clean and follow with your primary care doctor.  Return with any eye pain or vision changes.

## 2021-05-16 NOTE — ED Triage Notes (Signed)
Pt c/o redness to left eye this morning. Denies pain or drainage

## 2021-05-16 NOTE — ED Provider Notes (Signed)
Emergency Department Provider Note   I have reviewed the triage vital signs and the nursing notes.   HISTORY  Chief Complaint Eye Problem   HPI Beverly Valentine is a 50 y.o. female with PMH of HTN presents to the emergency department with watering and redness to the left eye noticed this morning while getting ready for work.  She denies any pain or vision change.  She does not wear contact lenses.  She notes history of similar in the past and wanted to be evaluated as she has a young child, which she is caring for, at home and did not want to have anything contagious.  She is not having severe headaches.  No radiation of symptoms or other modifying factors.   Past Medical History:  Diagnosis Date   Hypertension     Patient Active Problem List   Diagnosis Date Noted   Hypercholesterolemia 02/17/2020   Essential hypertension 12/03/2017   Hair thinning 12/03/2017    Past Surgical History:  Procedure Laterality Date   CESAREAN SECTION      Allergies Patient has no known allergies.  Family History  Problem Relation Age of Onset   Hypertension Other     Social History Social History   Tobacco Use   Smoking status: Never   Smokeless tobacco: Never  Substance Use Topics   Alcohol use: Never   Drug use: Never    Review of Systems  Constitutional: No fever/chills Eyes: No visual changes. Positive left eye redness with watery discharge.  Skin: Negative for rash. Neurological: Negative for headaches   ____________________________________________   PHYSICAL EXAM:  VITAL SIGNS: ED Triage Vitals [05/16/21 0549]  Enc Vitals Group     BP (!) 144/89     Pulse Rate 91     Resp 16     Temp 97.8 F (36.6 C)     Temp Source Oral     SpO2 100 %     Weight 219 lb (99.3 kg)     Height 5\' 6"  (1.676 m)    Constitutional: Alert and oriented. Well appearing and in no acute distress. Eyes: Conjunctivae are very mildly injected on the left with watery discharge.  PERRL. EOMI. No periorbital tenderness or erythema.  Head: Atraumatic. Nose: No congestion/rhinnorhea. Mouth/Throat: Mucous membranes are moist.   Neck: No stridor.   Cardiovascular: Good peripheral circulation.  Respiratory: Normal respiratory effort.   Gastrointestinal: No distention.  Musculoskeletal: No gross deformities of extremities. Neurologic:  Normal speech and language.  Skin:  Skin is warm, dry and intact. No rash noted. ____________________________________________   PROCEDURES  Procedure(s) performed:   Procedures  None  ____________________________________________   INITIAL IMPRESSION / ASSESSMENT AND PLAN / ED COURSE  Pertinent labs & imaging results that were available during my care of the patient were reviewed by me and considered in my medical decision making (see chart for details).   Patient presents to the emergency department with mild discharge and erythema to the left eye without pain.  Exam is not consistent with orbital cellulitis, acute angle glaucoma, corneal abrasion.  Patient likely developing some mild conjunctivitis.  Unclear the exact origin and if bacterial versus viral versus allergic.  Plan for ophthalmic drops and supportive care at home.  Discussed ED return precautions.  Do not see an indication for referral to ophthalmology at this time w/o vision change or pain.    ____________________________________________  FINAL CLINICAL IMPRESSION(S) / ED DIAGNOSES  Final diagnoses:  Eye irritation    NEW  OUTPATIENT MEDICATIONS STARTED DURING THIS VISIT:  Discharge Medication List as of 05/16/2021  5:56 AM     START taking these medications   Details  trimethoprim-polymyxin b (POLYTRIM) ophthalmic solution Place 1 drop into the left eye every 4 (four) hours for 7 days., Starting Wed 05/16/2021, Until Wed 05/23/2021, Normal        Note:  This document was prepared using Dragon voice recognition software and may include unintentional  dictation errors.  Alona Bene, MD, [facility] Emergency Medicine    Shaquasia Caponigro, Arlyss Repress, MD 05/16/21 [phone]

## 2021-05-16 NOTE — Addendum Note (Signed)
Addended by: Clayborne Dana on: 05/16/2021 08:46 AM   Modules accepted: Orders

## 2021-05-18 ENCOUNTER — Telehealth: Payer: Self-pay | Admitting: General Practice

## 2021-05-18 NOTE — Telephone Encounter (Signed)
Transition Care Management Unsuccessful Follow-up Telephone Call  Date of discharge and from where:  05/16/21 from [facility]  Attempts:  1st Attempt  Reason for unsuccessful TCM follow-up call:  Missing or invalid number

## 2021-05-22 NOTE — Telephone Encounter (Signed)
Transition Care Management Unsuccessful Follow-up Telephone Call  Date of discharge and from where:  05/16/21 from [facility]  Attempts:  2nd Attempt  Reason for unsuccessful TCM follow-up call:  Missing or invalid number

## 2021-05-23 NOTE — Telephone Encounter (Signed)
Transition Care Management Unsuccessful Follow-up Telephone Call  Date of discharge and from where:  05/16/21 from [facility]  Attempts:  3rd Attempt  Reason for unsuccessful TCM follow-up call:  Missing or invalid number

## 2021-07-16 ENCOUNTER — Encounter (HOSPITAL_BASED_OUTPATIENT_CLINIC_OR_DEPARTMENT_OTHER): Payer: Self-pay

## 2021-07-16 ENCOUNTER — Emergency Department (HOSPITAL_BASED_OUTPATIENT_CLINIC_OR_DEPARTMENT_OTHER)
Admission: EM | Admit: 2021-07-16 | Discharge: 2021-07-16 | Disposition: A | Discharge status: Home / Self Care | Payer: BLUE CROSS/BLUE SHIELD | Attending: Emergency Medicine | Admitting: Emergency Medicine

## 2021-07-16 ENCOUNTER — Emergency Department (HOSPITAL_BASED_OUTPATIENT_CLINIC_OR_DEPARTMENT_OTHER): Payer: BLUE CROSS/BLUE SHIELD

## 2021-07-16 ENCOUNTER — Other Ambulatory Visit: Payer: Self-pay

## 2021-07-16 DIAGNOSIS — R0789 Other chest pain: Secondary | ICD-10-CM | POA: Diagnosis present

## 2021-07-16 DIAGNOSIS — R072 Precordial pain: Secondary | ICD-10-CM | POA: Diagnosis not present

## 2021-07-16 DIAGNOSIS — I10 Essential (primary) hypertension: Secondary | ICD-10-CM | POA: Diagnosis not present

## 2021-07-16 DIAGNOSIS — Z79899 Other long term (current) drug therapy: Secondary | ICD-10-CM | POA: Diagnosis not present

## 2021-07-16 DIAGNOSIS — R079 Chest pain, unspecified: Secondary | ICD-10-CM

## 2021-07-16 HISTORY — DX: Pure hypercholesterolemia, unspecified: E78.00

## 2021-07-16 LAB — CBC
HCT: 43 % (ref 36.0–46.0)
Hemoglobin: 14.4 g/dL (ref 12.0–15.0)
MCH: 28.7 pg (ref 26.0–34.0)
MCHC: 33.5 g/dL (ref 30.0–36.0)
MCV: 85.8 fL (ref 80.0–100.0)
Platelets: 266 10*3/uL (ref 150–400)
RBC: 5.01 MIL/uL (ref 3.87–5.11)
RDW: 13.6 % (ref 11.5–15.5)
WBC: 4.7 10*3/uL (ref 4.0–10.5)
nRBC: 0 % (ref 0.0–0.2)

## 2021-07-16 LAB — BASIC METABOLIC PANEL
Anion gap: 8 (ref 5–15)
BUN: 22 mg/dL — ABNORMAL HIGH (ref 6–20)
CO2: 30 mmol/L (ref 22–32)
Calcium: 10.2 mg/dL (ref 8.9–10.3)
Chloride: 102 mmol/L (ref 98–111)
Creatinine, Ser: 1 mg/dL (ref 0.44–1.00)
GFR, Estimated: >60 mL/min (ref >60)
Glucose, Bld: 79 mg/dL (ref 70–99)
Potassium: 4.4 mmol/L (ref 3.5–5.1)
Sodium: 140 mmol/L (ref 135–145)

## 2021-07-16 LAB — TROPONIN I (HIGH SENSITIVITY)
Troponin I (High Sensitivity): 3 ng/L (ref <18)
Troponin I (High Sensitivity): 2 ng/L (ref <18)

## 2021-07-16 LAB — PREGNANCY, URINE: Preg Test, Ur: NEGATIVE

## 2021-07-16 LAB — CBG MONITORING, ED: Glucose-Capillary: 93 mg/dL (ref 70–99)

## 2021-07-16 MED ORDER — ASPIRIN 81 MG PO CHEW
324.0000 mg | CHEWABLE_TABLET | Freq: Once | ORAL | Status: AC
  Administered 2021-07-16: 324 mg via ORAL
  Filled 2021-07-16: qty 4

## 2021-07-16 NOTE — Discharge Instructions (Addendum)

## 2021-07-16 NOTE — ED Provider Notes (Signed)
[facility] EMERGENCY DEPARTMENT Provider Note   CSN: [id number] Arrival date & time: 07/16/21  [id number]     History  Chief Complaint  Patient presents with   Chest Pain    Beverly Valentine is a 51 y.o. female.  HPI  HPI: A 51 year old patient with a history of hypertension, hypercholesterolemia and obesity presents for evaluation of chest pain. Initial onset of pain was more than 6 hours ago. The patient's chest pain is not worse with exertion. The patient's chest pain is middle- or left-sided, is not well-localized, is not described as heaviness/pressure/tightness, is not sharp and does not radiate to the arms/jaw/neck. The patient does not complain of nausea and denies diaphoresis. The patient has no history of stroke, has no history of peripheral artery disease, has not smoked in the past 90 days, denies any history of treated diabetes and has no relevant family history of coronary artery disease (first degree relative at less than age 37).   Pain started about a week ago.  Off-and-on initially, yesterday was more constant.  Pain feels like muscle sore and it is slightly worse with movement of the extremity.  No specific trigger however.  Patient took ibuprofen and the symptoms got better yesterday.  However this morning, she woke up and had the same pain, prompting her to come to the ER.  Pain is not pleuritic or exertional.  Pt has no hx of PE, DVT and denies any exogenous hormone (testosterone / estrogen) use, long distance travels or surgery in the past 6 weeks, active cancer, recent immobilization.    Home Medications Prior to Admission medications   Medication Sig Start Date End Date Taking? Authorizing Provider  lisinopril-hydrochlorothiazide (ZESTORETIC) 20-12.5 MG tablet Take 1 tablet by mouth 2 (two) times daily. 05/15/21  Yes Terrilyn Saver, NP  metoprolol tartrate (LOPRESSOR) 50 MG tablet Take 1 tablet (50 mg total) by mouth 2 (two) times daily. 09/12/20  Yes Samuel Bouche, NP  rosuvastatin (CRESTOR) 10 MG tablet Take 1 tablet (10 mg total) by mouth daily. 05/15/21  Yes Terrilyn Saver, NP  cloNIDine (CATAPRES) 0.1 MG tablet Take 1 tablet (0.1 mg total) by mouth 2 (two) times daily. 05/15/21   Terrilyn Saver, NP      Allergies    Patient has no known allergies.    Review of Systems   Review of Systems  All other systems reviewed and are negative.  Physical Exam Updated Vital Signs BP (!) 129/115    Pulse (!) 56    Temp 98.5 F (36.9 C)    Resp 18    Ht 5\' 6"  (1.676 m)    Wt 97.5 kg    SpO2 100%    BMI 34.70 kg/m  Physical Exam Vitals and nursing note reviewed.  Constitutional:      Appearance: She is well-developed.  HENT:     Head: Atraumatic.  Cardiovascular:     Rate and Rhythm: Normal rate.     Heart sounds: Normal heart sounds.  Pulmonary:     Effort: Pulmonary effort is normal.  Musculoskeletal:     Cervical back: Normal range of motion and neck supple.     Right lower leg: No tenderness. No edema.     Left lower leg: No tenderness. No edema.  Skin:    General: Skin is warm and dry.  Neurological:     Mental Status: She is alert and oriented to person, place, and time.    ED Results /  Procedures / Treatments   Labs (all labs ordered are listed, but only abnormal results are displayed) Labs Reviewed  BASIC METABOLIC PANEL - Abnormal; Notable for the following components:      Result Value   BUN 22 (*)    All other components within normal limits  CBC  PREGNANCY, URINE  CBG MONITORING, ED  TROPONIN I (HIGH SENSITIVITY)  TROPONIN I (HIGH SENSITIVITY)    EKG EKG Interpretation  Date/Time:  Monday July 16 2021 07:36:39 EST Ventricular Rate:  65 PR Interval:  167 QRS Duration: 91 QT Interval:  393 QTC Calculation: 409 R Axis:   21 Text Interpretation: Sinus rhythm RSR' in V1 or V2, right VCD or RVH No acute changes No old tracing to compare Confirmed by Varney Biles [phone]) on 07/16/2021 8:29:07  AM  Radiology DG Chest 1 View  Result Date: 07/16/2021 CLINICAL DATA:  Mid chest pain Possible muscle strain EXAM: CHEST  1 VIEW COMPARISON:  08/25/2018 FINDINGS: The heart size and mediastinal contours are within normal limits. Both lungs are clear. The visualized skeletal structures are unremarkable. IMPRESSION: No active disease. Electronically Signed   By: Miachel Roux M.D.   On: 07/16/2021 09:49    Procedures Procedures    Medications Ordered in ED Medications  aspirin chewable tablet 324 mg (324 mg Oral Given 07/16/21 0847)    ED Course/ Medical Decision Making/ A&P Clinical Course as of 07/16/21 1132  Mon Jul 16, 2021  1130 Trops x 2 are neg. I will advise patient to follow-up with PCP in this case given that the suspicion for ACS is quite low.  PCP can decide if patient needs cardiology evaluation urgently.  In the interim, strict ER return precautions discussed with the patient.  She will come back to the ER if she starts having progression of the pain, pain is radiating to her neck jaw, back and she is having associated symptoms like shortness of breath, nausea, sweating. [AN]  1131 DG Chest 1 View Visualize independently to evaluate for pneumothorax, mediastinal widening -neither of which were present. [AN]    Clinical Course User Index [AN] Varney Biles, MD   HEAR Score: 4                       Medical Decision Making Amount and/or Complexity of Data Reviewed Labs: ordered. Radiology: ordered.  Risk OTC drugs.   This patient presents to the ED with chief complaint(s) of chest pain with pertinent past medical history of hypertension, hyperlipidemia, obesity which further complicates the presenting complaint. The complaint involves an extensive differential diagnosis and treatment options and also carries with it a high risk of complications and morbidity.    The differential diagnosis includes" Differential diagnosis includes: ACS syndrome Aortic  dissection Myocarditis Pericarditis PE Pneumothorax Musculoskeletal pain PUD / Gastritis / Esophagitis Esophageal spasm    The pain is atypical.  Patient's Wells score is 0, she is PERC negative therefore although we considered diagnostic work-up for PE which included D-dimer AND/OR CT PE, patient does not need either of those.  Symptoms are not clearly defined as musculoskeletal, GI, pleuritic, cardiac on evaluation.  Patient's main concern is to make sure she is not having any cardiac issues.  We will get delta troponin, chest x-ray and EKG along with basic labs.     Final Clinical Impression(s) / ED Diagnoses Final diagnoses:  Precordial chest pain    Rx / DC Orders ED Discharge Orders  None         Varney Biles, MD 07/16/21 [phone]

## 2021-07-16 NOTE — ED Triage Notes (Signed)
Pt reports possible pulled muscle . Woke up yesterday morning with a stretching like pain across chest. Pt took aleve which relieved pain. No injury. No radiation

## 2021-07-18 ENCOUNTER — Telehealth: Payer: Self-pay | Admitting: General Practice

## 2021-07-18 NOTE — Telephone Encounter (Signed)
Transition Care Management Follow-up Telephone Call ?Date of discharge and from where: 07/16/21 from [facility] med center ?How have you been since you were released from the hospital? Doing much better.  ?Any questions or concerns? No ? ?Items Reviewed: ?Did the pt receive and understand the discharge instructions provided? Yes  ?Medications obtained and verified? No  ?Other? No  ?Any new allergies since your discharge? No  ?Dietary orders reviewed? Yes ?Do you have support at home? Yes  ? ?Home Care and Equipment/Supplies: ?Were home health services ordered? no ? ?Functional Questionnaire: (I = Independent and D = Dependent) ?ADLs: I ? ?Bathing/Dressing- I ? ?Meal Prep- I ? ?Eating- I ? ?Maintaining continence- I ? ?Transferring/Ambulation- I ? ?Managing Meds- I ? ?Follow up appointments reviewed: ? ?PCP Hospital f/u appt confirmed? No  Patient did not want to schedule a follow up at this time. She will call back as needed. ?[facility] f/u appt confirmed? No   ?Are transportation arrangements needed? No  ?If their condition worsens, is the pt aware to call PCP or go to the Emergency Dept.? Yes ?Was the patient provided with contact information for the PCP's office or ED? Yes ?Was to pt encouraged to call back with questions or concerns? Yes  ?

## 2021-10-30 ENCOUNTER — Other Ambulatory Visit: Payer: Self-pay | Admitting: Medical-Surgical

## 2021-11-08 ENCOUNTER — Encounter: Payer: Self-pay | Admitting: Medical-Surgical

## 2021-11-13 ENCOUNTER — Encounter: Payer: Self-pay | Admitting: Medical-Surgical

## 2021-11-13 ENCOUNTER — Ambulatory Visit (INDEPENDENT_AMBULATORY_CARE_PROVIDER_SITE_OTHER): Payer: BLUE CROSS/BLUE SHIELD | Admitting: Medical-Surgical

## 2021-11-13 VITALS — BP 147/80 | HR 64 | Resp 20 | Ht 66.0 in | Wt 224.4 lb

## 2021-11-13 DIAGNOSIS — E7800 Pure hypercholesterolemia, unspecified: Secondary | ICD-10-CM | POA: Diagnosis not present

## 2021-11-13 DIAGNOSIS — L659 Nonscarring hair loss, unspecified: Secondary | ICD-10-CM

## 2021-11-13 DIAGNOSIS — Z6836 Body mass index (BMI) 36.0-36.9, adult: Secondary | ICD-10-CM

## 2021-11-13 DIAGNOSIS — Z1231 Encounter for screening mammogram for malignant neoplasm of breast: Secondary | ICD-10-CM

## 2021-11-13 DIAGNOSIS — I10 Essential (primary) hypertension: Secondary | ICD-10-CM

## 2021-11-13 DIAGNOSIS — Z1211 Encounter for screening for malignant neoplasm of colon: Secondary | ICD-10-CM

## 2021-11-13 MED ORDER — LISINOPRIL 10 MG PO TABS: 10.0000 mg | ORAL_TABLET | Freq: Every day | ORAL | 3 refills | Status: DC

## 2021-11-14 ENCOUNTER — Other Ambulatory Visit: Payer: Self-pay | Admitting: Medical-Surgical

## 2021-11-14 DIAGNOSIS — E7800 Pure hypercholesterolemia, unspecified: Secondary | ICD-10-CM

## 2021-11-14 LAB — CBC WITH DIFFERENTIAL/PLATELET
Absolute Monocytes: 519 cells/uL (ref 200–950)
Basophils Absolute: 29 cells/uL (ref 0–200)
Basophils Relative: 0.6 %
Eosinophils Absolute: 39 cells/uL (ref 15–500)
Eosinophils Relative: 0.8 %
HCT: 43.6 % (ref 35.0–45.0)
Hemoglobin: 14.5 g/dL (ref 11.7–15.5)
Lymphs Abs: 2063 cells/uL (ref 850–3900)
MCH: 27.9 pg (ref 27.0–33.0)
MCHC: 33.3 g/dL (ref 32.0–36.0)
MCV: 84 fL (ref 80.0–100.0)
MPV: 9.9 fL (ref 7.5–12.5)
Monocytes Relative: 10.6 %
Neutro Abs: 2249 cells/uL (ref 1500–7800)
Neutrophils Relative %: 45.9 %
Platelets: 282 10*3/uL (ref 140–400)
RBC: 5.19 10*6/uL — ABNORMAL HIGH (ref 3.80–5.10)
RDW: 13 % (ref 11.0–15.0)
Total Lymphocyte: 42.1 %
WBC: 4.9 10*3/uL (ref 3.8–10.8)

## 2021-11-14 LAB — LIPID PANEL
Cholesterol: 197 mg/dL (ref <200)
HDL: 51 mg/dL (ref >=50)
LDL Cholesterol (Calc): 126 mg/dL (calc) — ABNORMAL HIGH
Non-HDL Cholesterol (Calc): 146 mg/dL (calc) — ABNORMAL HIGH (ref <130)
Total CHOL/HDL Ratio: 3.9 (calc) (ref <5.0)
Triglycerides: 98 mg/dL (ref <150)

## 2021-11-14 LAB — COMPLETE METABOLIC PANEL WITH GFR
AG Ratio: 1.5 (calc) (ref 1.0–2.5)
ALT: 18 U/L (ref 6–29)
AST: 19 U/L (ref 10–35)
Albumin: 4.3 g/dL (ref 3.6–5.1)
Alkaline phosphatase (APISO): 79 U/L (ref 37–153)
BUN: 24 mg/dL (ref 7–25)
CO2: 28 mmol/L (ref 20–32)
Calcium: 10.6 mg/dL — ABNORMAL HIGH (ref 8.6–10.4)
Chloride: 103 mmol/L (ref 98–110)
Creat: 0.91 mg/dL (ref 0.50–1.03)
Globulin: 2.8 g/dL (calc) (ref 1.9–3.7)
Glucose, Bld: 77 mg/dL (ref 65–99)
Potassium: 3.7 mmol/L (ref 3.5–5.3)
Sodium: 142 mmol/L (ref 135–146)
Total Bilirubin: 0.5 mg/dL (ref 0.2–1.2)
Total Protein: 7.1 g/dL (ref 6.1–8.1)
eGFR: 76 mL/min/{1.73_m2} (ref >=60)

## 2021-11-14 MED ORDER — ROSUVASTATIN CALCIUM 20 MG PO TABS: 20.0000 mg | ORAL_TABLET | Freq: Every day | ORAL | 1 refills | Status: DC

## 2021-11-14 MED ORDER — ROSUVASTATIN CALCIUM 20 MG PO TABS: 10.0000 mg | ORAL_TABLET | Freq: Every day | ORAL | 1 refills | Status: DC

## 2021-11-27 ENCOUNTER — Ambulatory Visit: Payer: BLUE CROSS/BLUE SHIELD

## 2021-11-29 ENCOUNTER — Ambulatory Visit: Payer: BLUE CROSS/BLUE SHIELD

## 2021-12-01 LAB — COLOGUARD

## 2021-12-06 ENCOUNTER — Ambulatory Visit (INDEPENDENT_AMBULATORY_CARE_PROVIDER_SITE_OTHER): Payer: BLUE CROSS/BLUE SHIELD

## 2021-12-06 DIAGNOSIS — Z1231 Encounter for screening mammogram for malignant neoplasm of breast: Secondary | ICD-10-CM | POA: Diagnosis not present

## 2021-12-11 ENCOUNTER — Ambulatory Visit (INDEPENDENT_AMBULATORY_CARE_PROVIDER_SITE_OTHER): Payer: BLUE CROSS/BLUE SHIELD | Admitting: Medical-Surgical

## 2021-12-11 VITALS — BP 124/44 | HR 72 | Temp 97.9°F | Ht 66.0 in | Wt 220.0 lb

## 2021-12-11 DIAGNOSIS — I10 Essential (primary) hypertension: Secondary | ICD-10-CM

## 2021-12-11 MED ORDER — LISINOPRIL 30 MG PO TABS: 30.0000 mg | ORAL_TABLET | Freq: Every day | ORAL | 0 refills | Status: DC

## 2021-12-11 NOTE — Progress Notes (Signed)
Lisinopril 30 mg daily sent to the pharmacy.  Continue other medications as prescribed.  Continue to monitor blood pressure at home with goal of 130/80 or less.  Low-sodium diet and regular intentional exercise with a goal for weight loss recommended.  Return in 2 weeks for nurse visit for blood pressure check.  Reach out sooner if hypotensive symptoms persist or worsen. ___________________________________________ Thayer Ohm, DNP, APRN, FNP-BC Primary Care and Sports Medicine [facility]

## 2021-12-11 NOTE — Progress Notes (Signed)
Patient is here for blood pressure check. Denies trouble sleeping, palpitations, dizziness, lightheadedness, blurry vision, chest pain, shortness of breath, headaches and/or medication problems.  Patient mentioned that she felt "fuzzy; in a fog" after taking her blood pressure medications. Patient's blood pressure was out of goal range. Patient sat for 20 minutes. Blood pressure recheck was not at goal. Provider notified of current blood pressure readings. Per provider, patient is to stop taking lisinopril/hctz combo. Lisinopril rx will be increased to 30 mg. Patient was updated regarding current bp medications regimen. New rx pended.   Patient informed to schedule next [facility] appointment in 2 weeks for bp check.

## 2021-12-25 ENCOUNTER — Ambulatory Visit (INDEPENDENT_AMBULATORY_CARE_PROVIDER_SITE_OTHER): Payer: BLUE CROSS/BLUE SHIELD | Admitting: Family Medicine

## 2021-12-25 VITALS — BP 152/72 | HR 71 | Temp 98.0°F | Resp 18

## 2021-12-25 DIAGNOSIS — J101 Influenza due to other identified influenza virus with other respiratory manifestations: Secondary | ICD-10-CM | POA: Insufficient documentation

## 2021-12-25 DIAGNOSIS — R051 Acute cough: Secondary | ICD-10-CM | POA: Diagnosis not present

## 2021-12-25 LAB — POCT INFLUENZA A/B
Influenza A, POC: POSITIVE — AB
Influenza B, POC: POSITIVE — AB

## 2021-12-25 MED ORDER — BENZONATATE 100 MG PO CAPS: 100.0000 mg | ORAL_CAPSULE | Freq: Two times a day (BID) | ORAL | 0 refills | Status: DC

## 2021-12-25 NOTE — Assessment & Plan Note (Signed)
-   sent tessalon perles in for cough

## 2021-12-25 NOTE — Progress Notes (Signed)
Acute Office Visit  Subjective:     Patient ID: Beverly Valentine, female    DOB: [birth year], 51 y.o.   MRN: [id number]  Chief Complaint  Patient presents with   Hypertension   Cough    HPI Patient is in today for cough. She was seen as a nurse visit and was not doing well and I was asked to see the patient. Pt had symptoms start over a week ago. She admits to coughing, achiness, runny nose.   Review of Systems  Constitutional:  Negative for chills and fever.  HENT:         Rhinorrhea  Respiratory:  Positive for cough. Negative for shortness of breath.   Cardiovascular:  Negative for chest pain.  Musculoskeletal:        Muscle aches and pain  Neurological:  Negative for headaches.        Objective:    BP (!) 152/72   Pulse 71   Temp 98 F (36.7 C) (Oral)   Resp 18   SpO2 100%    Physical Exam Vitals and nursing note reviewed.  Constitutional:      General: She is not in acute distress.    Appearance: Normal appearance.     Comments: Pt wearing mask  HENT:     Head: Normocephalic and atraumatic.     Right Ear: External ear normal.     Left Ear: External ear normal.     Nose: Rhinorrhea present.  Eyes:     Conjunctiva/sclera: Conjunctivae normal.  Cardiovascular:     Rate and Rhythm: Normal rate and regular rhythm.  Pulmonary:     Effort: Pulmonary effort is normal.     Breath sounds: Normal breath sounds.     Comments: No wheezing or rales present. Clear lung fields Neurological:     General: No focal deficit present.     Mental Status: She is alert and oriented to person, place, and time.  Psychiatric:        Mood and Affect: Mood normal.        Behavior: Behavior normal.        Thought Content: Thought content normal.        Judgment: Judgment normal.     Results for orders placed or performed in visit on 12/25/21  POCT Influenza A/B  Result Value Ref Range   Influenza A, POC Positive (A) Negative   Influenza B, POC Positive (A) Negative         Assessment & Plan:   Problem List Items Addressed This Visit       Respiratory   Influenza A    - tested and interpreted test for flu. Pt is flu A and B positive.  - unfortunately since symptoms started over a week ago she is not eligible for treatment other than supportive care -discussed having family members get prophylactic medicine and notify their doctors so they don't get the flu - recommend Increase Vit C, D, and zinc  - recommend Rest and drink lots of fluids       Influenza B     Other   Acute cough - Primary    - sent tessalon perles in for cough      Relevant Medications   benzonatate (TESSALON) 100 MG capsule   Other Relevant Orders   POCT Influenza A/B (Completed)    Meds ordered this encounter  Medications   benzonatate (TESSALON) 100 MG capsule    Sig: Take 1 capsule (100 mg  total) by mouth 2 (two) times daily.    Dispense:  40 capsule    Refill:  0    Return if symptoms worsen or fail to improve.  Charlton Amor, DO

## 2021-12-25 NOTE — Patient Instructions (Signed)
Have your family members get prophylactic medicine and notify their doctors so they don't get the flu  Increase Vit C, D, and zinc   Rest and drink lots of fluids

## 2021-12-25 NOTE — Assessment & Plan Note (Signed)
-   tested and interpreted test for flu. Pt is flu A and B positive.  - unfortunately since symptoms started over a week ago she is not eligible for treatment other than supportive care -discussed having family members get prophylactic medicine and notify their doctors so they don't get the flu - recommend Increase Vit C, D, and zinc  - recommend Rest and drink lots of fluids

## 2022-01-01 ENCOUNTER — Other Ambulatory Visit: Payer: Self-pay | Admitting: Medical-Surgical

## 2022-01-01 DIAGNOSIS — I10 Essential (primary) hypertension: Secondary | ICD-10-CM

## 2022-01-08 ENCOUNTER — Ambulatory Visit: Payer: BLUE CROSS/BLUE SHIELD

## 2022-01-14 ENCOUNTER — Emergency Department (HOSPITAL_BASED_OUTPATIENT_CLINIC_OR_DEPARTMENT_OTHER)
Admission: EM | Admit: 2022-01-14 | Discharge: 2022-01-14 | Disposition: A | Discharge status: Home / Self Care | Payer: BLUE CROSS/BLUE SHIELD | Attending: Emergency Medicine | Admitting: Emergency Medicine

## 2022-01-14 ENCOUNTER — Other Ambulatory Visit: Payer: Self-pay

## 2022-01-14 ENCOUNTER — Encounter (HOSPITAL_BASED_OUTPATIENT_CLINIC_OR_DEPARTMENT_OTHER): Payer: Self-pay | Admitting: Emergency Medicine

## 2022-01-14 DIAGNOSIS — H938X2 Other specified disorders of left ear: Secondary | ICD-10-CM | POA: Diagnosis not present

## 2022-01-14 DIAGNOSIS — J0110 Acute frontal sinusitis, unspecified: Secondary | ICD-10-CM | POA: Diagnosis not present

## 2022-01-14 DIAGNOSIS — Z79899 Other long term (current) drug therapy: Secondary | ICD-10-CM | POA: Insufficient documentation

## 2022-01-14 DIAGNOSIS — I10 Essential (primary) hypertension: Secondary | ICD-10-CM | POA: Insufficient documentation

## 2022-01-14 DIAGNOSIS — R0981 Nasal congestion: Secondary | ICD-10-CM | POA: Diagnosis present

## 2022-01-14 MED ORDER — FLUTICASONE PROPIONATE 50 MCG/ACT NA SUSP: 2.0000 | Freq: Every day | NASAL | 1 refills | Status: DC

## 2022-01-14 MED ORDER — AMOXICILLIN-POT CLAVULANATE 875-125 MG PO TABS: 1.0000 | ORAL_TABLET | Freq: Two times a day (BID) | ORAL | 0 refills | Status: DC

## 2022-01-14 MED ORDER — LORATADINE 10 MG PO TABS: 10.0000 mg | ORAL_TABLET | Freq: Every day | ORAL | 0 refills | Status: DC

## 2022-01-14 NOTE — ED Provider Notes (Signed)
[facility] EMERGENCY DEPARTMENT Provider Note   CSN: [id number] Arrival date & time: 01/14/22  0102     History Chief Complaint  Patient presents with   Ear Fullness    Beverly Valentine is a 51 y.o. female HTN and HLD presents to the ED for evaluation of left ear fullness and nasal congestion.  Patient reports that she had a cough and cold last week and has had lasting nasal congestion and ear fullness.  She has been experiencing these symptoms for over 10 days.  She reports he does not have a cough anymore but still has the nasal congestion and left ear fullness.  She reports that it, feels like she is underwater.  Denies any fevers.  No neck pain.  No body aches.  She has tried some over-the-counter high blood pressure cough and cold medication with some relief.   Ear Fullness Pertinent negatives include no chest pain, no abdominal pain, no headaches and no shortness of breath.       Home Medications Prior to Admission medications   Medication Sig Start Date End Date Taking? Authorizing Provider  benzonatate (TESSALON) 100 MG capsule Take 1 capsule (100 mg total) by mouth 2 (two) times daily. 12/25/21   Charlton Amor, DO  cloNIDine (CATAPRES) 0.1 MG tablet Take 1 tablet (0.1 mg total) by mouth 2 (two) times daily. 05/15/21   Clayborne Dana, NP  erythromycin ophthalmic ointment SMARTSIG:In Eye(s) 12/24/21   [provider]  lisinopril (ZESTRIL) 30 MG tablet Take 1 tablet (30 mg total) by mouth daily. 01/11/22   Christen Butter, NP  metoprolol tartrate (LOPRESSOR) 50 MG tablet Take 1 tablet (50 mg total) by mouth 2 (two) times daily. Appt for further refills 11/01/21   Christen Butter, NP  rosuvastatin (CRESTOR) 20 MG tablet Take 1 tablet (20 mg total) by mouth daily. 11/14/21   Christen Butter, NP      Allergies    Patient has no known allergies.    Review of Systems   Review of Systems  Constitutional:  Negative for chills and fever.  HENT:  Positive for congestion, ear  pain, rhinorrhea and sinus pressure. Negative for sore throat.   Respiratory:  Negative for cough and shortness of breath.   Cardiovascular:  Negative for chest pain.  Gastrointestinal:  Negative for abdominal pain, nausea and vomiting.  Musculoskeletal:  Negative for back pain and neck pain.  Neurological:  Negative for headaches.    Physical Exam Updated Vital Signs BP (!) 158/91 (BP Location: Left Arm)   Pulse 69   Temp 98 F (36.7 C) (Oral)   Resp 20   Ht 5\' 6"  (1.676 m)   Wt 99.8 kg   SpO2 99%   BMI 35.51 kg/m  Physical Exam Vitals and nursing note reviewed.  Constitutional:      General: She is not in acute distress.    Appearance: She is not ill-appearing or toxic-appearing.  HENT:     Head: Normocephalic and atraumatic.     Right Ear: Tympanic membrane, ear canal and external ear normal.     Left Ear: Tympanic membrane, ear canal and external ear normal.     Ears:     Comments: Bilateral clear TM effusions.  No erythema or swelling noted to the outer canals.  No pain on manipulation of the pinna or tragus.  No mastoid tenderness or swelling.  No overlying skin changes noted to the ear or surrounding ear.    Nose:  Comments: Bilateral nasal turbinate edema and erythema with scant clear nasal discharge.    Mouth/Throat:     Mouth: Mucous membranes are moist.     Comments: No pharyngeal erythema, exudate, or edema noted.  Uvula midline.  Airway patent.  Moist mucous membranes. Eyes:     General: No scleral icterus.    Conjunctiva/sclera: Conjunctivae normal.  Cardiovascular:     Rate and Rhythm: Normal rate.  Pulmonary:     Effort: Pulmonary effort is normal. No respiratory distress.  Abdominal:     General: Abdomen is flat.     Palpations: Abdomen is soft.  Musculoskeletal:        General: No deformity.     Cervical back: Normal range of motion.  Skin:    Findings: No rash.  Neurological:     General: No focal deficit present.     Mental Status: She is  alert.     Gait: Gait normal.     Comments: No facial droop noted.  Sensation equal on the face.     ED Results / Procedures / Treatments   Labs (all labs ordered are listed, but only abnormal results are displayed) Labs Reviewed - No data to display  EKG None  Radiology No results found.  Procedures Procedures   Medications Ordered in ED Medications - No data to display  ED Course/ Medical Decision Making/ A&P                           Medical Decision Making  51 year old female presents the emergency room for evaluation of cold symptoms and left ear fullness for the past 7 to 10 days.  Differential diagnosis includes was not limited to otitis media, otitis externa, mass, perforated eardrum, sinusitis, viral illness, mastoiditis.  Vital signs show mild hypertension otherwise unremarkable.  Physical exam as noted above.  Exam is not consistent with mastoiditis, otitis media, or otitis externa.  She is at increased risk for otitis media.  We did discuss these return precautions.  Given the patient's bilateral TM effusions as well as nasal congestion and rhinorrhea.  We will give the patient some Flonase as well as some Claritin.  Given that she has had the symptoms for over 10 days, she meets MIPS criteria for antibiotic for sinusitis.  Confirmed with patient that she does not have any drug allergies.  We will place her on Augmentin.  We discussed tricked return precautions and red flag symptoms.  Patient verbalized understanding and agrees to the plan.  Patient is stable being discharged home in good condition.  Final Clinical Impression(s) / ED Diagnoses Final diagnoses:  Nasal congestion  Sensation of fullness in ear, left  Acute non-recurrent frontal sinusitis    Rx / DC Orders ED Discharge Orders          Ordered    fluticasone (FLONASE) 50 MCG/ACT nasal spray  Daily        01/14/22 1122    loratadine (CLARITIN) 10 MG tablet  Daily        01/14/22 1122     amoxicillin-clavulanate (AUGMENTIN) 875-125 MG tablet  Every 12 hours        01/14/22 1126              Achille Rich, [place] 01/14/22 1131    Ernie Avena, MD 01/14/22 1224

## 2022-01-14 NOTE — ED Triage Notes (Signed)
Pt states her left ear feels fullness with pain since last week.  No known fever.  Some nausea in the am.  Pt had cold 3 weeks ago.  Residual symptoms of runny nose and cough.  NAD

## 2022-01-14 NOTE — Discharge Instructions (Addendum)
You were seen in the emergency room and for evaluation of your nasal congestion and ear fullness.  Your exam shows that you have some nasal congestion and as well as some TM fullness, but do not see any infection.  I likely think you are experiencing this because of your congestion.  I would like for you to take Claritin once daily to add on to your regimen.  Additionally, you can use the Flonase nasal spray daily.  If these medications are not covered by your insurance, you can pick these up over-the-counter.  I have also included information on how to perform a nasal sinus rinse.  Please make sure you are using DISTILLED water.  Additionally, I am prescribing you an antibiotic to take twice daily for the next 7 days.  Follow-up with your primary care doctor as needed.  If you have any concerns, new or worsening symptoms, please return to the nearest emergency department for reevaluation.

## 2022-01-16 ENCOUNTER — Other Ambulatory Visit: Payer: Self-pay | Admitting: Family Medicine

## 2022-01-16 ENCOUNTER — Telehealth: Payer: Self-pay | Admitting: General Practice

## 2022-01-16 ENCOUNTER — Ambulatory Visit (INDEPENDENT_AMBULATORY_CARE_PROVIDER_SITE_OTHER): Payer: BLUE CROSS/BLUE SHIELD | Admitting: Family Medicine

## 2022-01-16 VITALS — BP 154/81 | HR 68 | Resp 20

## 2022-01-16 DIAGNOSIS — I10 Essential (primary) hypertension: Secondary | ICD-10-CM | POA: Diagnosis not present

## 2022-01-16 MED ORDER — LISINOPRIL 40 MG PO TABS: 40.0000 mg | ORAL_TABLET | Freq: Every day | ORAL | 0 refills | Status: DC

## 2022-01-16 NOTE — Telephone Encounter (Signed)
Transition Care Management Unsuccessful Follow-up Telephone Call  Date of discharge and from where:  01/14/22 from [facility]  Attempts:  1st Attempt  Reason for unsuccessful TCM follow-up call:  Left voice message

## 2022-01-16 NOTE — Progress Notes (Signed)
BP elevated.  Increasing lisinopril to 40mg  daily.  Return in 2 weeks for BP check.   Medical screening examination/treatment was performed by qualified clinical staff member and as supervising physician I was immediately available for consultation/collaboration. I have reviewed documentation and agree with assessment and plan.  , DO

## 2022-01-16 NOTE — Progress Notes (Signed)
   Subjective:    Patient ID: Beverly Valentine, female    DOB: May 15, 1971, 51 y.o.   MRN: [id number]  HPI Patient is here for blood pressure check. Denies trouble sleeping, palpitations or medication problems.   Review of Systems     Objective:   Physical Exam        Assessment & Plan:   A refill for Lisinopril 40 MG was sent to the pharmacy. Patient advised to schedule afollow up with nurse in 2 weeks.

## 2022-01-18 NOTE — Telephone Encounter (Signed)
Transition Care Management Unsuccessful Follow-up Telephone Call  Date of discharge and from where:  01/14/22 from [facility]  Attempts:  2nd Attempt  Reason for unsuccessful TCM follow-up call:  Left voice message

## 2022-01-23 NOTE — Telephone Encounter (Signed)
Transition Care Management Unsuccessful Follow-up Telephone Call  Date of discharge and from where:  01/14/22 from [facility]  Attempts:  3rd Attempt  Reason for unsuccessful TCM follow-up call:  Left voice message

## 2022-01-24 ENCOUNTER — Encounter: Payer: BLUE CROSS/BLUE SHIELD | Admitting: Medical-Surgical

## 2022-01-26 ENCOUNTER — Other Ambulatory Visit: Payer: Self-pay | Admitting: Medical-Surgical

## 2022-01-30 ENCOUNTER — Ambulatory Visit: Payer: BLUE CROSS/BLUE SHIELD

## 2022-02-20 ENCOUNTER — Encounter: Payer: BLUE CROSS/BLUE SHIELD | Admitting: Medical-Surgical

## 2022-02-22 ENCOUNTER — Encounter: Payer: BLUE CROSS/BLUE SHIELD | Admitting: Medical-Surgical

## 2022-03-21 ENCOUNTER — Other Ambulatory Visit: Payer: Self-pay | Admitting: Medical-Surgical

## 2022-03-21 NOTE — Telephone Encounter (Signed)
A refill has been sent to get her to her appointment.

## 2022-03-21 NOTE — Telephone Encounter (Signed)
Has an appointment with you 03/25/2022

## 2022-03-25 ENCOUNTER — Ambulatory Visit (INDEPENDENT_AMBULATORY_CARE_PROVIDER_SITE_OTHER): Payer: BLUE CROSS/BLUE SHIELD | Admitting: Medical-Surgical

## 2022-03-25 DIAGNOSIS — Z91199 Patient's noncompliance with other medical treatment and regimen due to unspecified reason: Secondary | ICD-10-CM

## 2022-03-25 NOTE — Progress Notes (Unsigned)
Complete physical exam  Patient: Beverly Valentine   DOB: [birth year]   51 y.o. Female  MRN: [id number]  Subjective:    No chief complaint on file.   Beverly Valentine is a 51 y.o. female who presents today for a complete physical exam. She reports consuming a {diet types:17450} diet. {types:19826} She generally feels {DESC; WELL/FAIRLY WELL/POORLY:18703}. She reports sleeping {DESC; WELL/FAIRLY WELL/POORLY:18703}. She {does/does not:200015} have additional problems to discuss today.    Most recent fall risk assessment:    11/13/2021    3:08 PM  Fall Risk   Falls in the past year? 0  Number falls in past yr: 0  Injury with Fall? 0  Risk for fall due to : No Fall Risks  Follow up Falls evaluation completed     Most recent depression screenings:    11/13/2021    3:08 PM 11/13/2021    3:05 PM  PHQ 2/9 Scores  PHQ - 2 Score 0 0    {VISON DENTAL STD PSA (Optional):27386}  {History (Optional):23778}  Patient Care Team: Christen Butter, NP as PCP - General (Nurse Practitioner)   Outpatient Medications Prior to Visit  Medication Sig   amoxicillin-clavulanate (AUGMENTIN) 875-125 MG tablet Take 1 tablet by mouth every 12 (twelve) hours.   benzonatate (TESSALON) 100 MG capsule Take 1 capsule (100 mg total) by mouth 2 (two) times daily.   cloNIDine (CATAPRES) 0.1 MG tablet Take 1 tablet (0.1 mg total) by mouth 2 (two) times daily.   erythromycin ophthalmic ointment SMARTSIG:In Eye(s)   fluticasone (FLONASE) 50 MCG/ACT nasal spray Place 2 sprays into both nostrils daily.   lisinopril (ZESTRIL) 40 MG tablet Take 1 tablet (40 mg total) by mouth daily.   loratadine (CLARITIN) 10 MG tablet Take 1 tablet (10 mg total) by mouth daily.   metoprolol tartrate (LOPRESSOR) 50 MG tablet TAKE 1 TABLET BY MOUTH TWICE DAILY. APPT REQUIRED FOR FUTURE REFILLS   rosuvastatin (CRESTOR) 20 MG tablet Take 1 tablet (20 mg total) by mouth daily.   No facility-administered medications prior to visit.     ROS        Objective:     There were no vitals taken for this visit. {Vitals History (Optional):23777}  Physical Exam   No results found for any visits on 03/25/22. {Show previous labs (optional):23779}    Assessment & Plan:    Routine Health Maintenance and Physical Exam  Immunization History  Administered Date(s) Administered   Tdap 09/12/2020    Health Maintenance  Topic Date Due   PAP SMEAR-Modifier  09/06/2012   COLONOSCOPY (Pts 45-58yrs Insurance coverage will need to be confirmed)  Never done   Zoster Vaccines- Shingrix (1 of 2) Never done   COVID-19 Vaccine (1) 05/11/2022 (Originally 11/10/1971)   INFLUENZA VACCINE  08/18/2022 (Originally 12/18/2021)   MAMMOGRAM  12/07/2023   TETANUS/TDAP  09/13/2030   Hepatitis C Screening  Completed   HIV Screening  Completed   HPV VACCINES  Aged Out    Discussed health benefits of physical activity, and encouraged her to engage in regular exercise appropriate for her age and condition.  Problem List Items Addressed This Visit   None Visit Diagnoses     Annual physical exam    -  Primary   Cervical cancer screening       Colon cancer screening       Need for shingles vaccine          No follow-ups on file.     Christen Butter,  NP

## 2022-04-09 ENCOUNTER — Other Ambulatory Visit: Payer: Self-pay | Admitting: Family Medicine

## 2022-04-09 DIAGNOSIS — I10 Essential (primary) hypertension: Secondary | ICD-10-CM

## 2022-04-09 NOTE — Telephone Encounter (Signed)
Patient scheduled for 05/09/2022 @ 2:40. Tvt

## 2022-04-16 ENCOUNTER — Other Ambulatory Visit: Payer: Self-pay | Admitting: Medical-Surgical

## 2022-04-19 ENCOUNTER — Other Ambulatory Visit: Payer: Self-pay

## 2022-04-19 DIAGNOSIS — Z124 Encounter for screening for malignant neoplasm of cervix: Secondary | ICD-10-CM

## 2022-05-08 ENCOUNTER — Encounter: Payer: Self-pay | Admitting: Medical-Surgical

## 2022-05-08 ENCOUNTER — Ambulatory Visit (INDEPENDENT_AMBULATORY_CARE_PROVIDER_SITE_OTHER): Payer: BLUE CROSS/BLUE SHIELD | Admitting: Medical-Surgical

## 2022-05-08 VITALS — BP 164/89 | HR 61 | Resp 20 | Ht 66.0 in | Wt 220.0 lb

## 2022-05-08 DIAGNOSIS — Z0000 Encounter for general adult medical examination without abnormal findings: Secondary | ICD-10-CM

## 2022-05-08 DIAGNOSIS — Z131 Encounter for screening for diabetes mellitus: Secondary | ICD-10-CM | POA: Diagnosis not present

## 2022-05-08 DIAGNOSIS — I10 Essential (primary) hypertension: Secondary | ICD-10-CM

## 2022-05-08 DIAGNOSIS — Z13220 Encounter for screening for lipoid disorders: Secondary | ICD-10-CM

## 2022-05-08 DIAGNOSIS — Z1211 Encounter for screening for malignant neoplasm of colon: Secondary | ICD-10-CM | POA: Diagnosis not present

## 2022-05-08 MED ORDER — TRAZODONE HCL 50 MG PO TABS: 25.0000 mg | ORAL_TABLET | Freq: Every evening | ORAL | 1 refills | Status: DC | PRN

## 2022-05-08 MED ORDER — METOPROLOL SUCCINATE ER 50 MG PO TB24: 50.0000 mg | ORAL_TABLET | Freq: Every day | ORAL | 0 refills | Status: DC

## 2022-05-08 MED ORDER — AMLODIPINE BESYLATE 5 MG PO TABS: 5.0000 mg | ORAL_TABLET | Freq: Every day | ORAL | 0 refills | Status: DC

## 2022-05-08 NOTE — Progress Notes (Addendum)
Complete physical exam  Patient: Beverly Valentine   DOB: 03/07/1971   51 y.o. Female  MRN: [id number]  Subjective:    Chief Complaint  Patient presents with   Annual Exam   Beverly Valentine is a 51 y.o. female who presents today for a complete physical exam. She reports consuming a general diet. The patient does not participate in regular exercise at present. She generally feels well. She reports sleeping fairly well. She does have additional problems to discuss today.   Hypertension: Taking lisinopril 40 mg daily, Lopressor 50 mg twice daily, and clonidine 0.1 mg twice daily.  Tolerating all medications well without side effects.  Has only checked her blood pressure couple of times at home but notes that it was still elevated there.  Does not add excessive amounts of salt to her food but does eat readymade meals and processed foods quite often.  Not exercising regularly.   Notes that she does have considerable trouble sleeping.  She has tried melatonin in the past with no results.  Has not tried any other measures or over-the-counter options.  Most recent fall risk assessment:    11/13/2021    3:08 PM  Fall Risk   Falls in the past year? 0  Number falls in past yr: 0  Injury with Fall? 0  Risk for fall due to : No Fall Risks  Follow up Falls evaluation completed     Most recent depression screenings:    05/08/2022   10:43 AM 11/13/2021    3:08 PM  PHQ 2/9 Scores  PHQ - 2 Score 0 0    Vision:Not within last year , Dental: No current dental problems and Receives regular dental care, and STD: The patient denies history of sexually transmitted disease.    Patient Care Team: Christen Butter, NP as PCP - General (Nurse Practitioner)   Outpatient Medications Prior to Visit  Medication Sig   amoxicillin-clavulanate (AUGMENTIN) 875-125 MG tablet Take 1 tablet by mouth every 12 (twelve) hours.   benzonatate (TESSALON) 100 MG capsule Take 1 capsule (100 mg total) by mouth 2 (two) times  daily.   cloNIDine (CATAPRES) 0.1 MG tablet Take 1 tablet (0.1 mg total) by mouth 2 (two) times daily.   erythromycin ophthalmic ointment SMARTSIG:In Eye(s)   fluticasone (FLONASE) 50 MCG/ACT nasal spray Place 2 sprays into both nostrils daily.   lisinopril (ZESTRIL) 40 MG tablet Take 1 tablet by mouth once daily   loratadine (CLARITIN) 10 MG tablet Take 1 tablet (10 mg total) by mouth daily.   rosuvastatin (CRESTOR) 20 MG tablet Take 1 tablet (20 mg total) by mouth daily.   [DISCONTINUED] metoprolol tartrate (LOPRESSOR) 50 MG tablet TAKE 1 TABLET BY MOUTH TWICE DAILY . APPOINTMENT REQUIRED FOR FUTURE REFILLS   No facility-administered medications prior to visit.    Review of Systems  Constitutional:  Negative for chills, fever, malaise/fatigue and weight loss.  HENT:  Negative for congestion, ear pain, hearing loss, sinus pain and sore throat.   Eyes:  Negative for blurred vision, photophobia and pain.  Respiratory:  Negative for cough, shortness of breath and wheezing.   Cardiovascular:  Negative for chest pain, palpitations and leg swelling.  Gastrointestinal:  Negative for abdominal pain, constipation, diarrhea, heartburn, nausea and vomiting.  Genitourinary:  Negative for dysuria, frequency and urgency.  Musculoskeletal:  Negative for falls and neck pain.  Skin:  Negative for itching and rash.  Neurological:  Negative for dizziness, weakness and headaches.  Endo/Heme/Allergies:  Negative for  polydipsia. Does not bruise/bleed easily.  Psychiatric/Behavioral:  Negative for depression, substance abuse and suicidal ideas. The patient is not nervous/anxious.      Objective:    BP (!) 164/89   Pulse 61   Resp 20   Ht 5\' 6"  (1.676 m)   Wt 220 lb 0.6 oz (99.8 kg)   SpO2 99%   BMI 35.52 kg/m    Physical Exam Constitutional:      General: She is not in acute distress.    Appearance: Normal appearance. She is obese. She is not ill-appearing.  HENT:     Head: Normocephalic and  atraumatic.     Right Ear: Tympanic membrane, ear canal and external ear normal. There is no impacted cerumen.     Left Ear: Tympanic membrane, ear canal and external ear normal. There is no impacted cerumen.     Nose: Nose normal. No congestion or rhinorrhea.     Mouth/Throat:     Mouth: Mucous membranes are moist.     Pharynx: No oropharyngeal exudate or posterior oropharyngeal erythema.  Eyes:     General: No scleral icterus.       Right eye: No discharge.        Left eye: No discharge.     Extraocular Movements: Extraocular movements intact.     Conjunctiva/sclera: Conjunctivae normal.     Pupils: Pupils are equal, round, and reactive to light.  Neck:     Thyroid: No thyromegaly.     Vascular: No carotid bruit or JVD.     Trachea: Trachea normal.  Cardiovascular:     Rate and Rhythm: Normal rate and regular rhythm.     Pulses: Normal pulses.     Heart sounds: Normal heart sounds. No murmur heard.    No friction rub. No gallop.  Pulmonary:     Effort: Pulmonary effort is normal. No respiratory distress.     Breath sounds: Normal breath sounds. No wheezing.  Abdominal:     General: Bowel sounds are normal. There is no distension.     Palpations: Abdomen is soft.     Tenderness: There is no abdominal tenderness. There is no guarding.  Musculoskeletal:        General: Normal range of motion.     Cervical back: Normal range of motion and neck supple.  Lymphadenopathy:     Cervical: No cervical adenopathy.  Skin:    General: Skin is warm and dry.  Neurological:     Mental Status: She is alert and oriented to person, place, and time.     Cranial Nerves: No cranial nerve deficit.  Psychiatric:        Mood and Affect: Mood normal.        Behavior: Behavior normal.        Thought Content: Thought content normal.        Judgment: Judgment normal.      No results found for any visits on 05/08/22.     Assessment & Plan:    Routine Health Maintenance and Physical  Exam  Immunization History  Administered Date(s) Administered   Tdap 09/12/2020    Health Maintenance  Topic Date Due   PAP SMEAR-Modifier  09/06/2012   COLONOSCOPY (Pts 45-75yrs Insurance coverage will need to be confirmed)  Never done   Zoster Vaccines- Shingrix (1 of 2) Never done   COVID-19 Vaccine (1) 05/11/2022 (Originally 11/10/1971)   INFLUENZA VACCINE  08/18/2022 (Originally 12/18/2021)   MAMMOGRAM  12/07/2023   DTaP/Tdap/Td (2 -  Td or Tdap) 09/13/2030   Hepatitis C Screening  Completed   HIV Screening  Completed   HPV VACCINES  Aged Out    Discussed health benefits of physical activity, and encouraged her to engage in regular exercise appropriate for her age and condition.  1. Annual physical exam Checking labs as below.  Overdue for vision exam.  Recommend updating.  Wellness information provided with AVS. - Lipid panel - COMPLETE METABOLIC PANEL WITH GFR - CBC with Differential/Platelet  2. Colon cancer screening Reordering Cologuard. - Cologuard  3. Diabetes mellitus screening Checking hemoglobin A1c. - Hemoglobin A1c  4. Lipid screening Checking lipid panel. - Lipid panel  5. Essential hypertension Checking labs.  Blood pressure is uncontrolled here as well as at home.  Recheck of blood pressure 164/89.  For now continue lisinopril 40 mg daily and clonidine 0.1 mg twice daily.  Switching to Toprol-XL 50 mg daily.  Adding amlodipine 5 mg daily.  Recommend checking blood pressure at home with a goal of 130/80 or less.  If consistently higher, we will need to make more medication changes.  Low-sodium diet, regular intentional exercise, and weight loss to healthy weight always recommended.  6.  Trouble sleeping Adding trazodone 25-50 mg nightly as needed for sleep.   Return in about 2 weeks (around 05/22/2022) for nurse visit for BP check.   Christen Butter, NP

## 2022-05-09 ENCOUNTER — Ambulatory Visit: Payer: BLUE CROSS/BLUE SHIELD | Admitting: Medical-Surgical

## 2022-05-09 LAB — CBC WITH DIFFERENTIAL/PLATELET
Absolute Monocytes: 434 cells/uL (ref 200–950)
Basophils Absolute: 39 cells/uL (ref 0–200)
Basophils Relative: 0.9 %
Eosinophils Absolute: 39 cells/uL (ref 15–500)
Eosinophils Relative: 0.9 %
HCT: 44.2 % (ref 35.0–45.0)
Hemoglobin: 14.7 g/dL (ref 11.7–15.5)
Lymphs Abs: 2081 cells/uL (ref 850–3900)
MCH: 28 pg (ref 27.0–33.0)
MCHC: 33.3 g/dL (ref 32.0–36.0)
MCV: 84.2 fL (ref 80.0–100.0)
MPV: 10.5 fL (ref 7.5–12.5)
Monocytes Relative: 10.1 %
Neutro Abs: 1707 cells/uL (ref 1500–7800)
Neutrophils Relative %: 39.7 %
Platelets: 257 10*3/uL (ref 140–400)
RBC: 5.25 10*6/uL — ABNORMAL HIGH (ref 3.80–5.10)
RDW: 13.3 % (ref 11.0–15.0)
Total Lymphocyte: 48.4 %
WBC: 4.3 10*3/uL (ref 3.8–10.8)

## 2022-05-09 LAB — COMPLETE METABOLIC PANEL WITH GFR
AG Ratio: 1.5 (calc) (ref 1.0–2.5)
ALT: 17 U/L (ref 6–29)
AST: 16 U/L (ref 10–35)
Albumin: 4 g/dL (ref 3.6–5.1)
Alkaline phosphatase (APISO): 83 U/L (ref 37–153)
BUN: 13 mg/dL (ref 7–25)
CO2: 32 mmol/L (ref 20–32)
Calcium: 10.2 mg/dL (ref 8.6–10.4)
Chloride: 105 mmol/L (ref 98–110)
Creat: 0.84 mg/dL (ref 0.50–1.03)
Globulin: 2.6 g/dL (calc) (ref 1.9–3.7)
Glucose, Bld: 85 mg/dL (ref 65–99)
Potassium: 4.5 mmol/L (ref 3.5–5.3)
Sodium: 142 mmol/L (ref 135–146)
Total Bilirubin: 0.5 mg/dL (ref 0.2–1.2)
Total Protein: 6.6 g/dL (ref 6.1–8.1)
eGFR: 84 mL/min/{1.73_m2} (ref >=60)

## 2022-05-09 LAB — LIPID PANEL
Cholesterol: 184 mg/dL
HDL: 57 mg/dL
LDL Cholesterol (Calc): 110 mg/dL — ABNORMAL HIGH
Non-HDL Cholesterol (Calc): 127 mg/dL
Total CHOL/HDL Ratio: 3.2 (calc)
Triglycerides: 79 mg/dL

## 2022-05-09 LAB — HEMOGLOBIN A1C
Hgb A1c MFr Bld: 5.7 %{Hb} — ABNORMAL HIGH
Mean Plasma Glucose: 117 mg/dL
eAG (mmol/L): 6.5 mmol/L

## 2022-05-12 ENCOUNTER — Other Ambulatory Visit: Payer: Self-pay | Admitting: Medical-Surgical

## 2022-05-12 DIAGNOSIS — I10 Essential (primary) hypertension: Secondary | ICD-10-CM

## 2022-05-21 ENCOUNTER — Ambulatory Visit: Payer: BLUE CROSS/BLUE SHIELD

## 2022-05-27 ENCOUNTER — Ambulatory Visit: Payer: BLUE CROSS/BLUE SHIELD

## 2022-06-03 ENCOUNTER — Other Ambulatory Visit: Payer: Self-pay | Admitting: Medical-Surgical

## 2022-06-04 ENCOUNTER — Ambulatory Visit (INDEPENDENT_AMBULATORY_CARE_PROVIDER_SITE_OTHER): Payer: BLUE CROSS/BLUE SHIELD | Admitting: Medical-Surgical

## 2022-06-04 VITALS — BP 167/71 | HR 86 | Ht 66.0 in

## 2022-06-04 DIAGNOSIS — I10 Essential (primary) hypertension: Secondary | ICD-10-CM

## 2022-06-04 MED ORDER — CLONIDINE HCL 0.1 MG PO TABS: 0.1000 mg | ORAL_TABLET | Freq: Two times a day (BID) | ORAL | 1 refills | Status: DC

## 2022-06-04 MED ORDER — METOPROLOL SUCCINATE ER 50 MG PO TB24: ORAL_TABLET | ORAL | 0 refills | Status: DC

## 2022-06-04 MED ORDER — METOPROLOL SUCCINATE ER 50 MG PO TB24: ORAL_TABLET | ORAL | 1 refills | Status: DC

## 2022-06-04 MED ORDER — AMLODIPINE BESYLATE 10 MG PO TABS: 10.0000 mg | ORAL_TABLET | Freq: Every day | ORAL | 1 refills | Status: DC

## 2022-06-04 NOTE — Progress Notes (Signed)
   Established Patient Office Visit  Subjective   Patient ID: Beverly Valentine, female    DOB: [birth year]  Age: 52 y.o. MRN: [id number]  Chief Complaint  Patient presents with   Hypertension    Nurse visit - BP check    HPI  Hypertension- nurse visit BP check-  initial reading 167/71 and second reading 161/76- patient states home readings are 150's over 80's. She states she did get upset just before the appointment today . Patient states has been out of metoprolol succinate for about 2 days. Patient requesting rx rf of  amlodipine,  clonidine o.1mg   and  metoprolol succinate.  ROS    Objective:     BP (!) 167/71 (BP Location: Left Arm, Patient Position: Sitting, Cuff Size: Large)   Pulse 86   Ht 5\' 6"  (1.676 m)   SpO2 100%   BMI 35.52 kg/m    Physical Exam   No results found for any visits on 06/04/22.    The 10-year ASCVD risk score (Arnett DK, et al., 2019) is: 9.6%    Assessment & Plan:  Hypertension- BP check nurse visit -  per Samuel Bouche, NP -  will increase Amlodipine to 10mg  - patient will schedule a return  nurse visit in 2 weeks on the new strength of Amlodipine. - patient instructed to watch for swelling of fett and ankles with Amlodipine strength increase and that can use compression stockings for minor swelling but will need to contact us if swelling is severe or bothersome.  Problem List Items Addressed This Visit       Cardiovascular and Mediastinum   Essential hypertension - Primary    Return in about 2 weeks (around 06/18/2022) for nurse visit for BP check.    Rae Lips, LPN

## 2022-06-04 NOTE — Progress Notes (Signed)
Agree with documentation as below.  ___________________________________________ Delicia Berens L. Jesua Tamblyn, DNP, APRN, FNP-BC Primary Care and Sports Medicine [facility]  

## 2022-06-18 ENCOUNTER — Ambulatory Visit: Payer: BLUE CROSS/BLUE SHIELD

## 2022-07-01 IMAGING — DX DG CHEST 1V
1 series · 1 of 1 positions shown · non-contrast
Comparison: 08/25/2018

CLINICAL DATA: Mid chest pain

Possible muscle strain
EXAM:
CHEST  1 VIEW

[chest pa]
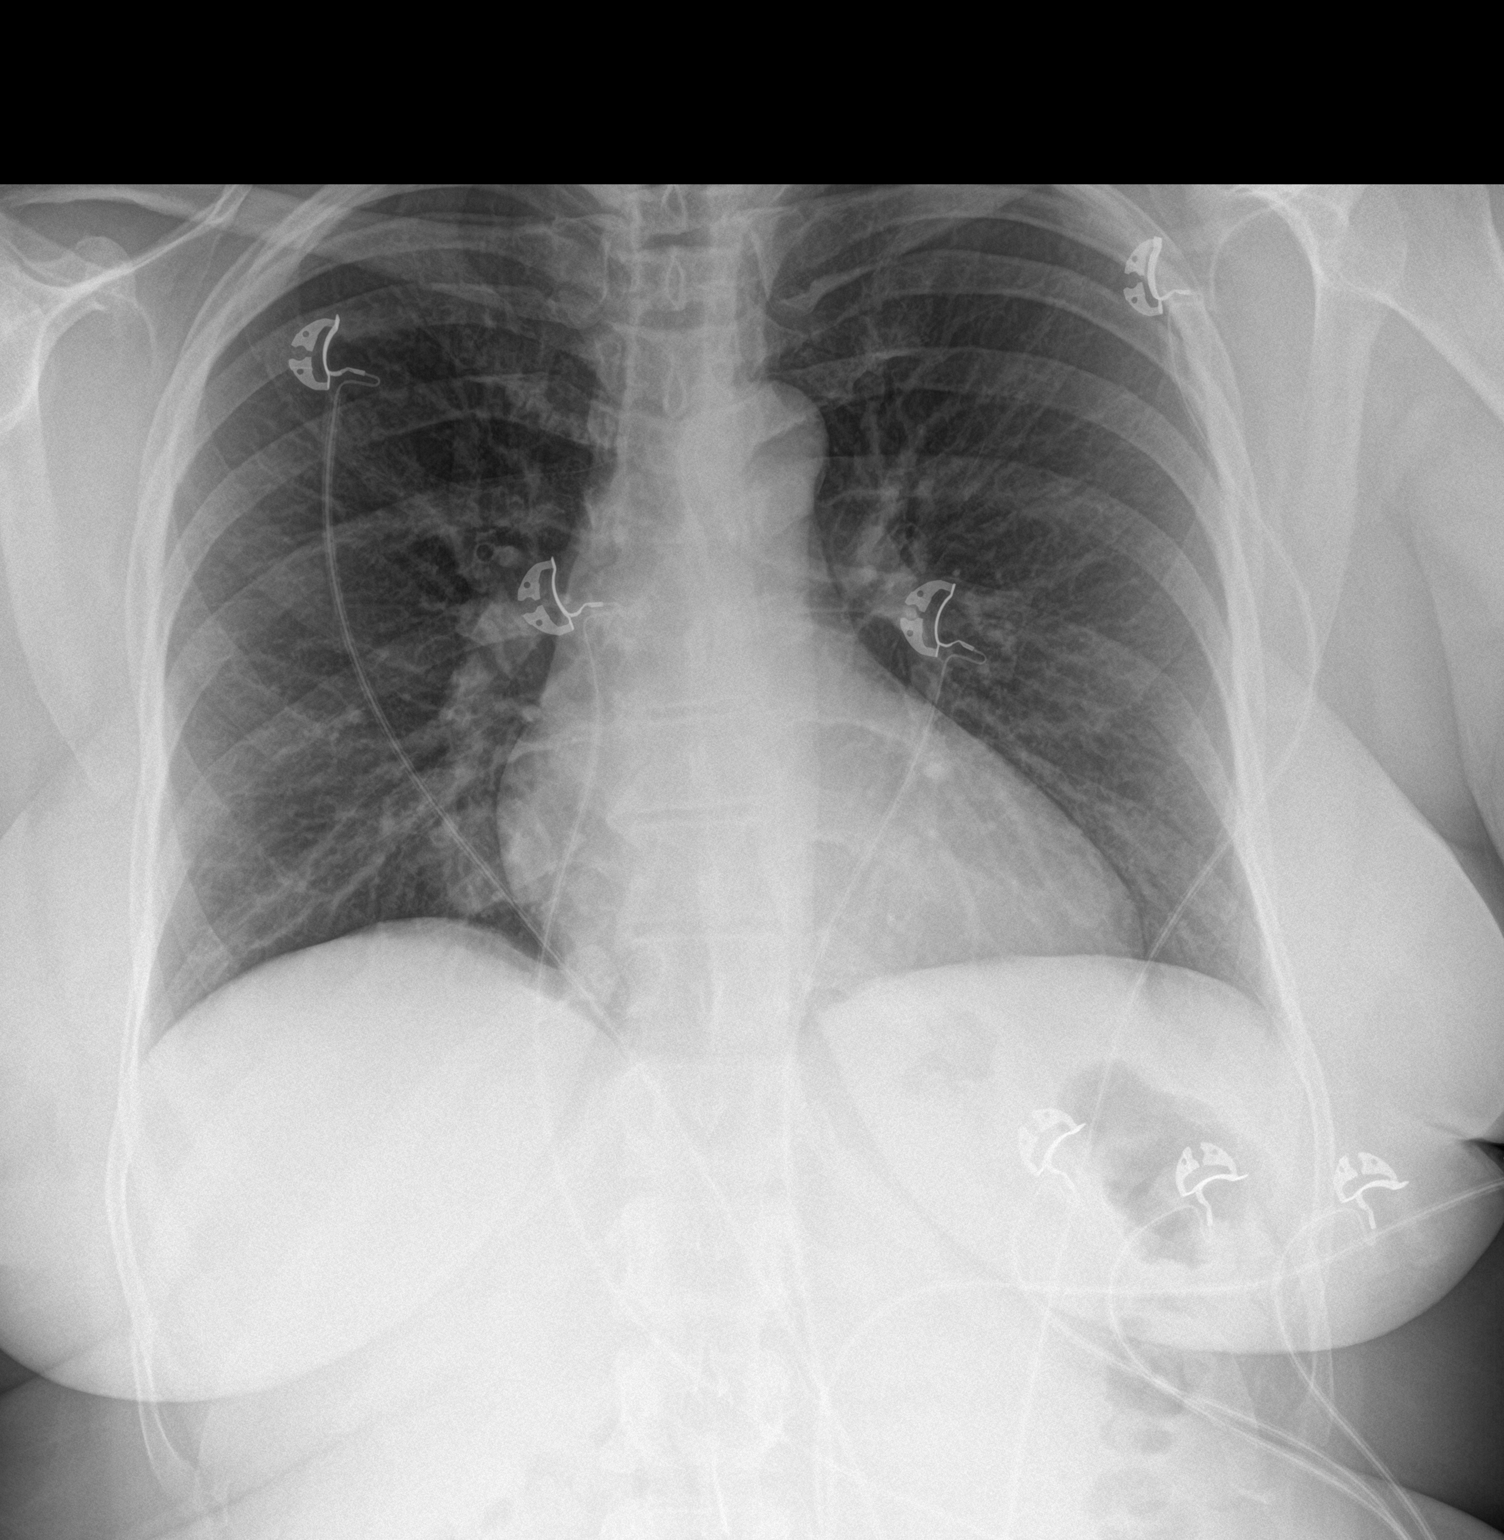

[1 of 1 positions shown; findings below may reference images not displayed]

FINDINGS: The heart size and mediastinal contours are within normal limits.
Both lungs are clear. The visualized skeletal structures are
unremarkable.
IMPRESSION: No active disease.

## 2022-07-15 ENCOUNTER — Other Ambulatory Visit: Payer: Self-pay | Admitting: Medical-Surgical

## 2022-07-15 DIAGNOSIS — I10 Essential (primary) hypertension: Secondary | ICD-10-CM

## 2022-07-17 ENCOUNTER — Other Ambulatory Visit: Payer: Self-pay | Admitting: Medical-Surgical

## 2022-07-17 DIAGNOSIS — I10 Essential (primary) hypertension: Secondary | ICD-10-CM

## 2022-07-31 ENCOUNTER — Other Ambulatory Visit: Payer: Self-pay | Admitting: Pharmacist

## 2022-07-31 NOTE — Patient Instructions (Signed)
Beverly Valentine,  Thank you for speaking with me today. As discussed, please call the office at [phone] to reschedule a blood pressure check.  Below are some helpful tips with checking blood pressure at home:  Check your blood pressure twice weekly, and any time you have concerning symptoms like headache, chest pain, dizziness, shortness of breath, or vision changes.   Our goal is less than 140/90.  To appropriately check your blood pressure, make sure you do the following:  1) Avoid caffeine, exercise, or tobacco products for 30 minutes before checking. Empty your bladder. 2) Sit with your back supported in a flat-backed chair. Rest your arm on something flat (arm of the chair, table, etc). 3) Sit still with your feet flat on the floor, resting, for at least 5 minutes.  4) Check your blood pressure. Take 1-2 readings.  5) Write down these readings and bring with you to any provider appointments.  Bring your home blood pressure machine with you to a provider's office for accuracy comparison at least once a year.   Make sure you take your blood pressure medications before you come to any office visit, even if you were asked to fast for labs.  Take care, Luana Shu, PharmD Clinical Pharmacist [facility] [phone]

## 2022-07-31 NOTE — Progress Notes (Signed)
Patient appearing on report for [employer] - Hypertension Control report due to last documented ambulatory blood pressure of 167/71 on 06/04/22. Next appointment with PCP is not scheduled   Outreached patient to discuss hypertension control and medication management.   Current antihypertensives: amlodipine '10mg'$  daily, clonidine 0.'1mg'$  BID, lisinopril '40mg'$  daily, metoprolol XL '50mg'$  daily  Patient has an automated upper arm home BP machine.  Current blood pressure readings: 123456 systolic    Patient denies hypotensive signs and symptoms including dizziness, lightheadedness.  Patient denies hypertensive symptoms including headache, chest pain, shortness of breath.    Assessment/Plan: - Currently unknown control, due for reassessment of PCP increase in amlodipine dosage. - - Reviewed goal blood pressure <140/90 - Reviewed appropriate administration of medication regimen - Discussed dietary modifications, such as reduced salt intake, focus on whole grains, vegetables, lean proteins - Discussed goal of 150 minutes of moderate intensity physical activity weekly - Recommend continue current medications, encouraged patient to call office to reschedule nurse BP visit, patient verbalized understanding  Larinda Buttery, PharmD Clinical Pharmacist [facility] [phone]

## 2022-08-02 ENCOUNTER — Other Ambulatory Visit: Payer: Self-pay | Admitting: Medical-Surgical

## 2022-09-01 ENCOUNTER — Other Ambulatory Visit: Payer: Self-pay | Admitting: Medical-Surgical

## 2022-09-01 DIAGNOSIS — I10 Essential (primary) hypertension: Secondary | ICD-10-CM

## 2022-10-31 ENCOUNTER — Other Ambulatory Visit: Payer: Self-pay | Admitting: Medical-Surgical

## 2022-11-29 ENCOUNTER — Other Ambulatory Visit: Payer: Self-pay | Admitting: Medical-Surgical

## 2022-11-30 ENCOUNTER — Other Ambulatory Visit: Payer: Self-pay

## 2022-11-30 ENCOUNTER — Emergency Department (HOSPITAL_BASED_OUTPATIENT_CLINIC_OR_DEPARTMENT_OTHER): Payer: BLUE CROSS/BLUE SHIELD

## 2022-11-30 ENCOUNTER — Encounter (HOSPITAL_BASED_OUTPATIENT_CLINIC_OR_DEPARTMENT_OTHER): Payer: Self-pay | Admitting: Emergency Medicine

## 2022-11-30 ENCOUNTER — Emergency Department (HOSPITAL_BASED_OUTPATIENT_CLINIC_OR_DEPARTMENT_OTHER)
Admission: EM | Admit: 2022-11-30 | Discharge: 2022-12-01 | Disposition: A | Discharge status: Home / Self Care | Payer: BLUE CROSS/BLUE SHIELD | Attending: Emergency Medicine | Admitting: Emergency Medicine

## 2022-11-30 ENCOUNTER — Other Ambulatory Visit: Payer: Self-pay | Admitting: Medical-Surgical

## 2022-11-30 DIAGNOSIS — R109 Unspecified abdominal pain: Secondary | ICD-10-CM | POA: Diagnosis not present

## 2022-11-30 DIAGNOSIS — Z79899 Other long term (current) drug therapy: Secondary | ICD-10-CM | POA: Diagnosis not present

## 2022-11-30 DIAGNOSIS — I10 Essential (primary) hypertension: Secondary | ICD-10-CM | POA: Insufficient documentation

## 2022-11-30 DIAGNOSIS — N10 Acute tubulo-interstitial nephritis: Secondary | ICD-10-CM

## 2022-11-30 DIAGNOSIS — R350 Frequency of micturition: Secondary | ICD-10-CM | POA: Diagnosis not present

## 2022-11-30 LAB — COMPREHENSIVE METABOLIC PANEL
ALT: 20 U/L (ref 0–44)
AST: 19 U/L (ref 15–41)
Albumin: 3.8 g/dL (ref 3.5–5.0)
Alkaline Phosphatase: 95 U/L (ref 38–126)
Anion gap: 8 (ref 5–15)
BUN: 21 mg/dL — ABNORMAL HIGH (ref 6–20)
CO2: 26 mmol/L (ref 22–32)
Calcium: 9.7 mg/dL (ref 8.9–10.3)
Chloride: 103 mmol/L (ref 98–111)
Creatinine, Ser: 1.25 mg/dL — ABNORMAL HIGH (ref 0.44–1.00)
GFR, Estimated: 52 mL/min — ABNORMAL LOW (ref >60)
Glucose, Bld: 99 mg/dL (ref 70–99)
Potassium: 3.4 mmol/L — ABNORMAL LOW (ref 3.5–5.1)
Sodium: 137 mmol/L (ref 135–145)
Total Bilirubin: 0.4 mg/dL (ref 0.3–1.2)
Total Protein: 7.2 g/dL (ref 6.5–8.1)

## 2022-11-30 LAB — URINALYSIS, ROUTINE W REFLEX MICROSCOPIC
Bilirubin Urine: NEGATIVE
Glucose, UA: 100 mg/dL — AB
Ketones, ur: NEGATIVE mg/dL
Leukocytes,Ua: NEGATIVE
Nitrite: POSITIVE — AB
Protein, ur: 100 mg/dL — AB
Specific Gravity, Urine: 1.025 (ref 1.005–1.030)
pH: 5.5 (ref 5.0–8.0)

## 2022-11-30 LAB — URINALYSIS, MICROSCOPIC (REFLEX): Bacteria, UA: NONE SEEN

## 2022-11-30 LAB — CBC
HCT: 43.4 % (ref 36.0–46.0)
Hemoglobin: 14.7 g/dL (ref 12.0–15.0)
MCH: 28.1 pg (ref 26.0–34.0)
MCHC: 33.9 g/dL (ref 30.0–36.0)
MCV: 83 fL (ref 80.0–100.0)
Platelets: 241 10*3/uL (ref 150–400)
RBC: 5.23 MIL/uL — ABNORMAL HIGH (ref 3.87–5.11)
RDW: 13.2 % (ref 11.5–15.5)
WBC: 5.6 10*3/uL (ref 4.0–10.5)
nRBC: 0 % (ref 0.0–0.2)

## 2022-11-30 MED ORDER — CEPHALEXIN 250 MG PO CAPS
500.0000 mg | ORAL_CAPSULE | Freq: Once | ORAL | Status: AC
  Administered 2022-12-01: 500 mg via ORAL
  Filled 2022-11-30: qty 2

## 2022-11-30 MED ORDER — HYDROCODONE-ACETAMINOPHEN 5-325 MG PO TABS
2.0000 | ORAL_TABLET | Freq: Once | ORAL | Status: AC
  Administered 2022-12-01: 2 via ORAL
  Filled 2022-11-30: qty 2

## 2022-11-30 MED ORDER — HYDROCODONE-ACETAMINOPHEN 5-325 MG PO TABS: 1.0000 | ORAL_TABLET | Freq: Four times a day (QID) | ORAL | 0 refills | Status: DC | PRN

## 2022-11-30 MED ORDER — CEPHALEXIN 500 MG PO CAPS: 500.0000 mg | ORAL_CAPSULE | Freq: Three times a day (TID) | ORAL | 0 refills | Status: DC

## 2022-11-30 NOTE — ED Provider Notes (Signed)
Beverly Valentine Provider Note   CSN: [id number] Arrival date & time: 11/30/22  2142     History  Chief Complaint  Patient presents with   Flank Pain    Jeniyah Cauthorn is a 52 y.o. female.  Patient is a 52 year old female with history of hypertension and hyperlipidemia.  Patient presenting today with complaints of left flank pain.  This has been ongoing for the past several days.  This began in the absence of any injury or trauma.  She does describe some urinary frequency and burning, but denies any fevers or chills.  She denies any radiation of the pain into her legs, weakness, or bowel complaints.  No alleviating factors.  The history is provided by the patient.       Home Medications Prior to Admission medications   Medication Sig Start Date End Date Taking? Authorizing Provider  amLODipine (NORVASC) 10 MG tablet Take 1 tablet (10 mg total) by mouth daily. 06/04/22   Christen Butter, NP  amoxicillin-clavulanate (AUGMENTIN) 875-125 MG tablet Take 1 tablet by mouth every 12 (twelve) hours. 01/14/22   Achille Rich, PA-C  benzonatate (TESSALON) 100 MG capsule Take 1 capsule (100 mg total) by mouth 2 (two) times daily. 12/25/21   Charlton Amor, DO  cloNIDine (CATAPRES) 0.1 MG tablet Take 1 tablet (0.1 mg total) by mouth 2 (two) times daily. 06/04/22   Christen Butter, NP  erythromycin ophthalmic ointment SMARTSIG:In Eye(s) 12/24/21   [provider]  fluticasone (FLONASE) 50 MCG/ACT nasal spray Place 2 sprays into both nostrils daily. 01/14/22   Achille Rich, PA-C  lisinopril (ZESTRIL) 40 MG tablet Take 1 tablet by mouth once daily 09/02/22   Christen Butter, NP  loratadine (CLARITIN) 10 MG tablet Take 1 tablet (10 mg total) by mouth daily. 01/14/22   Achille Rich, PA-C  metoprolol succinate (TOPROL-XL) 50 MG 24 hr tablet TAKE 1 TABLET BY MOUTH ONCE DAILY WITH OR IMMEDIATELY FOLLOWING A MEAL 06/04/22   Christen Butter, NP  rosuvastatin (CRESTOR) 20 MG  tablet Take 1 tablet (20 mg total) by mouth daily. 11/14/21   Christen Butter, NP  traZODone (DESYREL) 50 MG tablet TAKE 1/2 (ONE-HALF) TO 1 TABLET BY MOUTH AT BEDTIME AS NEEDED FOR SLEEP . APPOINTMENT REQUIRED FOR FUTURE REFILLS 11/29/22   Christen Butter, NP      Allergies    Patient has no known allergies.    Review of Systems   Review of Systems  All other systems reviewed and are negative.   Physical Exam Updated Vital Signs BP (!) 191/93   Pulse 77   Temp 97.8 F (36.6 C) (Oral)   Resp 19   SpO2 96%  Physical Exam Vitals and nursing note reviewed.  Constitutional:      General: She is not in acute distress.    Appearance: Normal appearance. She is well-developed. She is not diaphoretic.  HENT:     Head: Normocephalic and atraumatic.  Cardiovascular:     Rate and Rhythm: Normal rate and regular rhythm.     Heart sounds: No murmur heard.    No friction rub. No gallop.  Pulmonary:     Effort: Pulmonary effort is normal. No respiratory distress.     Breath sounds: Normal breath sounds. No wheezing.  Abdominal:     General: Bowel sounds are normal. There is no distension.     Palpations: Abdomen is soft.     Tenderness: There is no abdominal tenderness. There is left  CVA tenderness.     Comments: There is mild tenderness in the left flank.  Musculoskeletal:        General: Normal range of motion.     Cervical back: Normal range of motion and neck supple.  Skin:    General: Skin is warm and dry.  Neurological:     General: No focal deficit present.     Mental Status: She is alert and oriented to person, place, and time.     ED Results / Procedures / Treatments   Labs (all labs ordered are listed, but only abnormal results are displayed) Labs Reviewed  CBC - Abnormal; Notable for the following components:      Result Value   RBC 5.23 (*)    All other components within normal limits  URINALYSIS, ROUTINE W REFLEX MICROSCOPIC - Abnormal; Notable for the following  components:   Color, Urine ORANGE (*)    APPearance HAZY (*)    Glucose, UA 100 (*)    Hgb urine dipstick MODERATE (*)    Protein, ur 100 (*)    Nitrite POSITIVE (*)    All other components within normal limits  URINALYSIS, MICROSCOPIC (REFLEX)  COMPREHENSIVE METABOLIC PANEL    EKG None  Radiology CT Renal Stone Study  Result Date: 11/30/2022 CLINICAL DATA:  Left-sided flank pain for several days, initial encounter EXAM: CT ABDOMEN AND PELVIS WITHOUT CONTRAST TECHNIQUE: Multidetector CT imaging of the abdomen and pelvis was performed following the standard protocol without IV contrast. RADIATION DOSE REDUCTION: This exam was performed according to the departmental dose-optimization program which includes automated exposure control, adjustment of the mA and/or kV according to patient size and/or use of iterative reconstruction technique. COMPARISON:  None Available. FINDINGS: Lower chest: Lung bases demonstrate evidence of a 2.5 cm soft tissue mass lesion in the right lower lobe. This measures approximately 29 Hounsfield units. Hepatobiliary: Gallbladder is within normal limits. Scattered hypodensities are noted within the liver likely representing cysts. The largest of these is noted in the tip of the liver on the right measuring 1.5 cm. No further follow-up is recommended. Pancreas: Unremarkable. No pancreatic ductal dilatation or surrounding inflammatory changes. Spleen: Normal in size without focal abnormality. Adrenals/Urinary Tract: Adrenal glands are within normal limits. Kidneys demonstrate a normal appearance bilaterally. No renal calculi or obstructive changes are seen. The bladder is partially distended. Stomach/Bowel: No obstructive or inflammatory changes of the colon are seen. The appendix is within normal limits. Small bowel and stomach are unremarkable. Vascular/Lymphatic: No significant vascular findings are present. No enlarged abdominal or pelvic lymph nodes. Reproductive: Status  post hysterectomy. No adnexal masses. Other: No abdominal wall hernia or abnormality. No abdominopelvic ascites. Musculoskeletal: No acute or significant osseous findings. IMPRESSION: No renal calculi or urinary tract obstructive changes. No acute abnormality to correspond with the given clinical history. 2.5 cm soft tissue lesion in the right lower lobe as described. Completion CT of the chest is recommended for further evaluation. Electronically Signed   By: Alcide Clever M.D.   On: 11/30/2022 22:47    Procedures Procedures    Medications Ordered in ED Medications  cephALEXin (KEFLEX) capsule 500 mg (has no administration in time range)  HYDROcodone-acetaminophen (NORCO/VICODIN) 5-325 MG per tablet 2 tablet (has no administration in time range)    ED Course/ Medical Decision Making/ A&P  Patient presenting with pain in the left flank as described in the HPI.  She arrives here with stable vital signs and is afebrile.  Physical examination reveals  mild left-sided CVA tenderness, but is otherwise unremarkable.  CBC and CMP obtained and are basically unremarkable.  There is no leukocytosis or significant electrolyte derangement.  Urinalysis reveals nitrite positive urine.  CT with renal protocol obtained showing no evidence for renal calculus or other abnormality.  Patient appears to have an uncomplicated UTI.  She will be given Keflex and hydrocodone and discharged with the same.  To return as needed for any problems.  Final Clinical Impression(s) / ED Diagnoses Final diagnoses:  None    Rx / DC Orders ED Discharge Orders     None         Geoffery Lyons, MD 11/30/22 2348

## 2022-11-30 NOTE — Discharge Instructions (Signed)
Begin taking Keflex as prescribed.  Begin taking ibuprofen 600 mg every 6 hours as needed for pain.  Begin taking hydrocodone as prescribed as needed for pain not relieved with ibuprofen.  Return to the emergency department if you develop severe pain, high fevers, or for other new and concerning symptoms.

## 2022-11-30 NOTE — ED Triage Notes (Signed)
Patient with left flank pain.  She states that it started on Monday.  No nausea or vomiting.  Patient states that it has increasingly gotten worse since Monday.

## 2022-12-01 ENCOUNTER — Other Ambulatory Visit: Payer: Self-pay | Admitting: Medical-Surgical

## 2022-12-01 DIAGNOSIS — I10 Essential (primary) hypertension: Secondary | ICD-10-CM

## 2022-12-01 MED ORDER — HYDROCODONE-ACETAMINOPHEN 5-325 MG PO TABS: 1.0000 | ORAL_TABLET | Freq: Four times a day (QID) | ORAL | 0 refills | Status: DC | PRN

## 2022-12-01 MED ORDER — CEPHALEXIN 500 MG PO CAPS: 500.0000 mg | ORAL_CAPSULE | Freq: Three times a day (TID) | ORAL | 0 refills | Status: DC

## 2022-12-01 NOTE — ED Notes (Signed)
Lab notified of add-on urine cx.

## 2022-12-02 LAB — URINE CULTURE: Culture: <10000 — AB

## 2022-12-05 ENCOUNTER — Encounter: Payer: Self-pay | Admitting: Medical-Surgical

## 2022-12-05 ENCOUNTER — Ambulatory Visit (INDEPENDENT_AMBULATORY_CARE_PROVIDER_SITE_OTHER): Payer: BLUE CROSS/BLUE SHIELD | Admitting: Medical-Surgical

## 2022-12-05 VITALS — BP 160/92 | HR 60 | Resp 20 | Ht 66.0 in | Wt 216.1 lb

## 2022-12-05 DIAGNOSIS — I10 Essential (primary) hypertension: Secondary | ICD-10-CM

## 2022-12-05 DIAGNOSIS — R918 Other nonspecific abnormal finding of lung field: Secondary | ICD-10-CM

## 2022-12-05 DIAGNOSIS — E7800 Pure hypercholesterolemia, unspecified: Secondary | ICD-10-CM | POA: Diagnosis not present

## 2022-12-05 DIAGNOSIS — R319 Hematuria, unspecified: Secondary | ICD-10-CM | POA: Diagnosis not present

## 2022-12-05 LAB — POCT URINALYSIS DIP (CLINITEK)
Bilirubin, UA: NEGATIVE
Glucose, UA: NEGATIVE mg/dL
Ketones, POC UA: NEGATIVE mg/dL
Leukocytes, UA: NEGATIVE
Nitrite, UA: NEGATIVE
POC PROTEIN,UA: 100 — AB
Spec Grav, UA: 1.025 (ref 1.010–1.025)
Urobilinogen, UA: 0.2 E.U./dL
pH, UA: 6 (ref 5.0–8.0)

## 2022-12-05 MED ORDER — AMLODIPINE BESYLATE 10 MG PO TABS: 10.0000 mg | ORAL_TABLET | Freq: Every day | ORAL | 1 refills | Status: DC

## 2022-12-05 MED ORDER — LISINOPRIL 40 MG PO TABS
40.0000 mg | ORAL_TABLET | Freq: Every day | ORAL | 0 refills | Status: DC
Start: 2022-12-05 — End: 2022-12-30

## 2022-12-05 MED ORDER — ROSUVASTATIN CALCIUM 20 MG PO TABS
20.0000 mg | ORAL_TABLET | Freq: Every day | ORAL | 1 refills | Status: DC
Start: 2022-12-05 — End: 2023-05-26

## 2022-12-05 MED ORDER — METOPROLOL SUCCINATE ER 50 MG PO TB24: ORAL_TABLET | ORAL | 1 refills | Status: DC

## 2022-12-05 NOTE — Progress Notes (Signed)
        Established patient visit  History, exam, impression, and plan:  1. Lung mass Pleasant 52 year old female presenting today for follow-up after a hospital visit for left flank pain.  She was seen in the ED on 11/30/2022 with complaint of left flank pain.  Urinalysis showed positive nitrites and she was sent home on oral antibiotics.  While she was in the hospital for evaluation, they did a CT renal stone study which was unremarkable except for a 2.5 cm mass at the right lower lobe that was partially imaged.  Recommended follow-up with CT chest for further evaluation.  Denies chest pain, shortness of breath, coughing, wheezing, fever, chills, night sweats, and unintentional weight loss.  She is not a smoker.  No previous history of lung concerns.  HRR, S1/S2 normal.  Lungs CTA.  Respirations even and unlabored.  Ordering chest CT without contrast. - CT Chest Wo Contrast; Future  2. Hematuria, unspecified type Has completed most of the antibiotic course that she was prescribed and notes that the left flank pain has resolved.  Plans to continue the antibiotic to complete the course.  Recheck of urinalysis today showed large amount of blood and protein however nitrites and leukocytes were now negative.  Sending for culture to verify clearance. - POCT URINALYSIS DIP (CLINITEK) - Urine Culture  3. Essential hypertension History of hypertension that requires multiple medications for management.  Has been taking lisinopril 40 mg daily and clonidine 0.1 mg twice daily as prescribed however notes that she has been off of the amlodipine and the Toprol XL for quite a while.  No refills were sent in for the medications and she did not question it.  Has not been checking blood pressures at home.  Denies concerning symptoms.  Blood pressure is significantly elevated on arrival and remains elevated on recheck.  Continue lisinopril 40 mg daily as prescribed.  Restart amlodipine 10 mg daily and Toprol XL 50 mg  daily.  Return in 2 weeks for a nurse visit for blood pressure check.  Would like to be able to eliminate clonidine as this can cause rebound hypertension and is causing her too much sedation to be able to take in the mornings. - lisinopril (ZESTRIL) 40 MG tablet; Take 1 tablet (40 mg total) by mouth daily. NEEDS APPOINTMENT FOR FURTHER REFILLS.  Dispense: 30 tablet; Refill: 0  4. Hypercholesterolemia Similar to above, she had previously been prescribed Crestor 20 mg daily but never received a refill and thought she had been taken off the medication and no one updated her.  With her history of hyperlipidemia and current cardiovascular risk, it is highly recommended she resume Crestor.  She is agreeable to this so refilling today. - rosuvastatin (CRESTOR) 20 MG tablet; Take 1 tablet (20 mg total) by mouth daily.  Dispense: 90 tablet; Refill: 1   Procedures performed this visit: None.  Return in about 2 weeks (around 12/19/2022) for nurse visit for BP check.  __________________________________ Thayer Ohm, DNP, APRN, FNP-BC Primary Care and Sports Medicine [facility] [place]

## 2022-12-06 LAB — URINE CULTURE
MICRO NUMBER:: 15217720
Result:: NO GROWTH
SPECIMEN QUALITY:: ADEQUATE

## 2022-12-07 ENCOUNTER — Emergency Department (HOSPITAL_BASED_OUTPATIENT_CLINIC_OR_DEPARTMENT_OTHER): Payer: BLUE CROSS/BLUE SHIELD

## 2022-12-07 ENCOUNTER — Other Ambulatory Visit: Payer: Self-pay

## 2022-12-07 ENCOUNTER — Emergency Department (HOSPITAL_BASED_OUTPATIENT_CLINIC_OR_DEPARTMENT_OTHER)
Admission: EM | Admit: 2022-12-07 | Discharge: 2022-12-08 | Disposition: A | Discharge status: Home / Self Care | Payer: BLUE CROSS/BLUE SHIELD | Attending: Emergency Medicine | Admitting: Emergency Medicine

## 2022-12-07 ENCOUNTER — Encounter (HOSPITAL_BASED_OUTPATIENT_CLINIC_OR_DEPARTMENT_OTHER): Payer: Self-pay | Admitting: Emergency Medicine

## 2022-12-07 DIAGNOSIS — R918 Other nonspecific abnormal finding of lung field: Secondary | ICD-10-CM | POA: Diagnosis not present

## 2022-12-07 DIAGNOSIS — I10 Essential (primary) hypertension: Secondary | ICD-10-CM | POA: Diagnosis not present

## 2022-12-07 DIAGNOSIS — R0602 Shortness of breath: Secondary | ICD-10-CM | POA: Diagnosis not present

## 2022-12-07 LAB — TROPONIN I (HIGH SENSITIVITY)
Troponin I (High Sensitivity): 3 ng/L (ref <18)
Troponin I (High Sensitivity): 3 ng/L (ref <18)

## 2022-12-07 LAB — BASIC METABOLIC PANEL
Anion gap: 10 (ref 5–15)
BUN: 21 mg/dL — ABNORMAL HIGH (ref 6–20)
CO2: 24 mmol/L (ref 22–32)
Calcium: 9.8 mg/dL (ref 8.9–10.3)
Chloride: 102 mmol/L (ref 98–111)
Creatinine, Ser: 0.98 mg/dL (ref 0.44–1.00)
GFR, Estimated: >60 mL/min (ref >60)
Glucose, Bld: 91 mg/dL (ref 70–99)
Potassium: 3.6 mmol/L (ref 3.5–5.1)
Sodium: 136 mmol/L (ref 135–145)

## 2022-12-07 LAB — CBC
HCT: 46.4 % — ABNORMAL HIGH (ref 36.0–46.0)
Hemoglobin: 15.6 g/dL — ABNORMAL HIGH (ref 12.0–15.0)
MCH: 28.2 pg (ref 26.0–34.0)
MCHC: 33.6 g/dL (ref 30.0–36.0)
MCV: 83.9 fL (ref 80.0–100.0)
Platelets: 276 10*3/uL (ref 150–400)
RBC: 5.53 MIL/uL — ABNORMAL HIGH (ref 3.87–5.11)
RDW: 13.3 % (ref 11.5–15.5)
WBC: 6.7 10*3/uL (ref 4.0–10.5)
nRBC: 0 % (ref 0.0–0.2)

## 2022-12-07 LAB — BRAIN NATRIURETIC PEPTIDE: B Natriuretic Peptide: 28.8 pg/mL (ref 0.0–100.0)

## 2022-12-07 MED ORDER — IOHEXOL 350 MG/ML SOLN
100.0000 mL | Freq: Once | INTRAVENOUS | Status: AC | PRN
  Administered 2022-12-07: 80 mL via INTRAVENOUS

## 2022-12-07 NOTE — ED Provider Notes (Signed)
EMERGENCY DEPARTMENT AT [facility] Provider Note   CSN: [id number] Arrival date & time: 12/07/22  1946     History {Add pertinent medical, surgical, social history, OB history to HPI:1} Chief Complaint  Patient presents with   Shortness of Breath    Beverly Valentine is a 52 y.o. female.   Shortness of Breath      Home Medications Prior to Admission medications   Medication Sig Start Date End Date Taking? Authorizing Provider  amLODipine (NORVASC) 10 MG tablet Take 1 tablet (10 mg total) by mouth daily. 12/05/22   Christen Butter, NP  cloNIDine (CATAPRES) 0.1 MG tablet Take 1 tablet (0.1 mg total) by mouth 2 (two) times daily. NEEDS APPOINTMENT FOR FURTHER REFILLS. 12/02/22   Christen Butter, NP  erythromycin ophthalmic ointment SMARTSIG:In Eye(s) 12/24/21   [provider]  fluticasone (FLONASE) 50 MCG/ACT nasal spray Place 2 sprays into both nostrils daily. 01/14/22   Achille Rich, PA-C  HYDROcodone-acetaminophen (NORCO) 5-325 MG tablet Take 1-2 tablets by mouth every 6 (six) hours as needed. 12/01/22   Geoffery Lyons, MD  lisinopril (ZESTRIL) 40 MG tablet Take 1 tablet (40 mg total) by mouth daily. NEEDS APPOINTMENT FOR FURTHER REFILLS. 12/05/22   Christen Butter, NP  loratadine (CLARITIN) 10 MG tablet Take 1 tablet (10 mg total) by mouth daily. 01/14/22   Achille Rich, PA-C  metoprolol succinate (TOPROL-XL) 50 MG 24 hr tablet TAKE 1 TABLET BY MOUTH ONCE DAILY WITH OR IMMEDIATELY FOLLOWING A MEAL 12/05/22   Christen Butter, NP  rosuvastatin (CRESTOR) 20 MG tablet Take 1 tablet (20 mg total) by mouth daily. 12/05/22   Christen Butter, NP  traZODone (DESYREL) 50 MG tablet TAKE 1/2 (ONE-HALF) TO 1 TABLET BY MOUTH AT BEDTIME AS NEEDED FOR SLEEP . APPOINTMENT REQUIRED FOR FUTURE REFILLS 11/29/22   Christen Butter, NP      Allergies    Patient has no known allergies.    Review of Systems   Review of Systems  Respiratory:  Positive for shortness of breath.     Physical  Exam Updated Vital Signs BP (!) 177/108   Pulse 63   Temp 98 F (36.7 C) (Oral)   Resp 18   Wt 98 kg   SpO2 99%   BMI 34.87 kg/m  Physical Exam  ED Results / Procedures / Treatments   Labs (all labs ordered are listed, but only abnormal results are displayed) Labs Reviewed  BASIC METABOLIC PANEL - Abnormal; Notable for the following components:      Result Value   BUN 21 (*)    All other components within normal limits  CBC - Abnormal; Notable for the following components:   RBC 5.53 (*)    Hemoglobin 15.6 (*)    HCT 46.4 (*)    All other components within normal limits  BRAIN NATRIURETIC PEPTIDE  TROPONIN I (HIGH SENSITIVITY)  TROPONIN I (HIGH SENSITIVITY)    EKG None  Radiology DG Chest 2 View  Result Date: 12/07/2022 CLINICAL DATA:  Shortness of breath. Recently diagnosed with lung mass. EXAM: CHEST - 2 VIEW COMPARISON:  07/16/2021, 11/30/2022. FINDINGS: The heart size and mediastinal contours are within normal limits. There is an oval mass in the infrahilar region in the right lower lobe measuring 3.1 cm. No consolidation, effusion, or pneumothorax. No acute osseous abnormality. IMPRESSION: 1. No active cardiopulmonary disease. 2. Oval mass in the right lower lobe measuring 3.1 cm. CT chest is recommended for further evaluation. Electronically Signed  By: Thornell Sartorius M.D.   On: 12/07/2022 20:30    Procedures Procedures  {Document cardiac monitor, telemetry assessment procedure when appropriate:1}  Medications Ordered in ED Medications  iohexol (OMNIPAQUE) 350 MG/ML injection 100 mL (80 mLs Intravenous Contrast Given 12/07/22 2303)    ED Course/ Medical Decision Making/ A&P   {   Click here for ABCD2, HEART and other calculatorsREFRESH Note before signing :1}                          Medical Decision Making Amount and/or Complexity of Data Reviewed Labs: ordered. Radiology: ordered.  Risk Prescription drug management.   ***  {Document critical care  time when appropriate:1} {Document review of labs and clinical decision tools ie heart score, Chads2Vasc2 etc:1}  {Document your independent review of radiology images, and any outside records:1} {Document your discussion with family members, caretakers, and with consultants:1} {Document social determinants of health affecting pt's care:1} {Document your decision making why or why not admission, treatments were needed:1} Final Clinical Impression(s) / ED Diagnoses Final diagnoses:  None    Rx / DC Orders ED Discharge Orders     None

## 2022-12-07 NOTE — ED Notes (Signed)
Patient resting quietly in stretcher, respirations even, unlabored, no acute distress noted. Denies needs at this time.  

## 2022-12-07 NOTE — ED Triage Notes (Signed)
Here for increased SOB since yesterday. Recently seen in ER and diagnosed with lung mass as incidental finding while being treated for UTI.  Denies CP, fever, abd pain, cough  H/o htn

## 2022-12-08 NOTE — Discharge Instructions (Addendum)
You were seen in the ER today for evaluation of your shortness of breath.  Your CT image shows the lung mass that was seen previously.  They are recommending you follow-up with repeat imaging.  I am going to refer you to an oncologist for you to call to follow-up with.  Please make sure you call to schedule an appointment.  Additionally, I have listed information on a blood pressure form for you to record your blood pressures to see if the new medication your primary care doctor started you on is effective.  Please make sure you are taking these throughout the day and being compliant with your medications.  If you have any concerns, new or worsening symptoms, please return to your nearest emergency department for evaluation.   Contact a doctor if: Your condition does not get better as soon as expected. You have a hard time doing your normal activities, even after you rest. You have new symptoms. You cannot walk up stairs. You cannot exercise the way you normally do. Get help right away if: Your shortness of breath gets worse. You have trouble breathing when you are resting. You feel light-headed or you faint. You have a cough that is not helped by medicines. You cough up blood. You have pain with breathing. You have pain in your chest, arms, shoulders, or belly (abdomen). You have a fever. These symptoms may be an emergency. Get help right away. Call 911. Do not wait to see if the symptoms will go away. Do not drive yourself to the hospital.

## 2022-12-08 NOTE — ED Notes (Signed)
Patient verbalizes understanding of discharge instructions. Opportunity for questioning and answers were provided. Armband removed by staff, pt discharged from ED. Ambulated out to lobby  

## 2022-12-09 ENCOUNTER — Telehealth: Payer: Self-pay | Admitting: General Practice

## 2022-12-09 NOTE — Transitions of Care (Post Inpatient/ED Visit) (Signed)
   12/09/2022  Name: Beverly Valentine MRN: [id number] DOB: [birth year]  Today's TOC FU Call Status: Today's TOC FU Call Status:: Unsuccessul Call (1st Attempt) Unsuccessful Call (1st Attempt) Date: 12/09/22  Attempted to reach the patient regarding the most recent Inpatient/ED visit.  Follow Up Plan: Additional outreach attempts will be made to reach the patient to complete the Transitions of Care (Post Inpatient/ED visit) call.   Signature [place] Charon, Control and instrumentation engineer

## 2022-12-10 ENCOUNTER — Telehealth: Payer: Self-pay | Admitting: Medical-Surgical

## 2022-12-10 NOTE — Telephone Encounter (Signed)
Pt called.  She is following up on referral  for mass on lung.

## 2022-12-11 ENCOUNTER — Encounter (HOSPITAL_BASED_OUTPATIENT_CLINIC_OR_DEPARTMENT_OTHER): Payer: Self-pay

## 2022-12-11 ENCOUNTER — Emergency Department (HOSPITAL_BASED_OUTPATIENT_CLINIC_OR_DEPARTMENT_OTHER): Payer: BLUE CROSS/BLUE SHIELD

## 2022-12-11 ENCOUNTER — Other Ambulatory Visit: Payer: Self-pay

## 2022-12-11 ENCOUNTER — Emergency Department (HOSPITAL_BASED_OUTPATIENT_CLINIC_OR_DEPARTMENT_OTHER)
Admission: EM | Admit: 2022-12-11 | Discharge: 2022-12-11 | Disposition: A | Discharge status: Home / Self Care | Payer: BLUE CROSS/BLUE SHIELD | Attending: Emergency Medicine | Admitting: Emergency Medicine

## 2022-12-11 DIAGNOSIS — M546 Pain in thoracic spine: Secondary | ICD-10-CM | POA: Insufficient documentation

## 2022-12-11 DIAGNOSIS — R109 Unspecified abdominal pain: Secondary | ICD-10-CM | POA: Diagnosis not present

## 2022-12-11 DIAGNOSIS — I10 Essential (primary) hypertension: Secondary | ICD-10-CM | POA: Insufficient documentation

## 2022-12-11 DIAGNOSIS — Z79899 Other long term (current) drug therapy: Secondary | ICD-10-CM | POA: Diagnosis not present

## 2022-12-11 DIAGNOSIS — M549 Dorsalgia, unspecified: Secondary | ICD-10-CM | POA: Diagnosis present

## 2022-12-11 LAB — CBC WITH DIFFERENTIAL/PLATELET
Abs Immature Granulocytes: 0 10*3/uL (ref 0.00–0.07)
Basophils Absolute: 0 10*3/uL (ref 0.0–0.1)
Basophils Relative: 1 %
Eosinophils Absolute: 0.1 10*3/uL (ref 0.0–0.5)
Eosinophils Relative: 1 %
HCT: 47.4 % — ABNORMAL HIGH (ref 36.0–46.0)
Hemoglobin: 15.5 g/dL — ABNORMAL HIGH (ref 12.0–15.0)
Immature Granulocytes: 0 %
Lymphocytes Relative: 39 %
Lymphs Abs: 1.7 10*3/uL (ref 0.7–4.0)
MCH: 27.5 pg (ref 26.0–34.0)
MCHC: 32.7 g/dL (ref 30.0–36.0)
MCV: 84 fL (ref 80.0–100.0)
Monocytes Absolute: 0.5 10*3/uL (ref 0.1–1.0)
Monocytes Relative: 12 %
Neutro Abs: 2.1 10*3/uL (ref 1.7–7.7)
Neutrophils Relative %: 47 %
Platelets: 249 10*3/uL (ref 150–400)
RBC: 5.64 MIL/uL — ABNORMAL HIGH (ref 3.87–5.11)
RDW: 13.2 % (ref 11.5–15.5)
WBC: 4.4 10*3/uL (ref 4.0–10.5)
nRBC: 0 % (ref 0.0–0.2)

## 2022-12-11 LAB — COMPREHENSIVE METABOLIC PANEL
ALT: 22 U/L (ref 0–44)
AST: 24 U/L (ref 15–41)
Albumin: 4 g/dL (ref 3.5–5.0)
Alkaline Phosphatase: 99 U/L (ref 38–126)
Anion gap: 9 (ref 5–15)
BUN: 17 mg/dL (ref 6–20)
CO2: 24 mmol/L (ref 22–32)
Calcium: 9.8 mg/dL (ref 8.9–10.3)
Chloride: 101 mmol/L (ref 98–111)
Creatinine, Ser: 0.91 mg/dL (ref 0.44–1.00)
GFR, Estimated: >60 mL/min (ref >60)
Glucose, Bld: 99 mg/dL (ref 70–99)
Potassium: 3.5 mmol/L (ref 3.5–5.1)
Sodium: 134 mmol/L — ABNORMAL LOW (ref 135–145)
Total Bilirubin: 0.7 mg/dL (ref 0.3–1.2)
Total Protein: 7.6 g/dL (ref 6.5–8.1)

## 2022-12-11 LAB — URINALYSIS, ROUTINE W REFLEX MICROSCOPIC
Bilirubin Urine: NEGATIVE
Glucose, UA: 100 mg/dL — AB
Ketones, ur: NEGATIVE mg/dL
Leukocytes,Ua: NEGATIVE
Nitrite: POSITIVE — AB
Protein, ur: 100 mg/dL — AB
Specific Gravity, Urine: 1.015 (ref 1.005–1.030)
pH: 5 (ref 5.0–8.0)

## 2022-12-11 LAB — URINALYSIS, MICROSCOPIC (REFLEX)

## 2022-12-11 LAB — LIPASE, BLOOD: Lipase: 33 U/L (ref 11–51)

## 2022-12-11 LAB — TROPONIN I (HIGH SENSITIVITY)
Troponin I (High Sensitivity): 3 ng/L (ref <18)
Troponin I (High Sensitivity): 3 ng/L (ref <18)

## 2022-12-11 MED ORDER — HYDROMORPHONE HCL 1 MG/ML IJ SOLN
1.0000 mg | Freq: Once | INTRAMUSCULAR | Status: AC
  Administered 2022-12-11: 1 mg via INTRAVENOUS
  Filled 2022-12-11: qty 1

## 2022-12-11 MED ORDER — ONDANSETRON HCL 4 MG/2ML IJ SOLN
4.0000 mg | Freq: Once | INTRAMUSCULAR | Status: AC
  Administered 2022-12-11: 4 mg via INTRAVENOUS
  Filled 2022-12-11: qty 2

## 2022-12-11 MED ORDER — HYDROCODONE-ACETAMINOPHEN 5-325 MG PO TABS: 1.0000 | ORAL_TABLET | Freq: Four times a day (QID) | ORAL | 0 refills | Status: DC | PRN

## 2022-12-11 MED ORDER — IOHEXOL 300 MG/ML  SOLN
100.0000 mL | Freq: Once | INTRAMUSCULAR | Status: AC | PRN
  Administered 2022-12-11: 100 mL via INTRAVENOUS

## 2022-12-11 MED ORDER — CYCLOBENZAPRINE HCL 10 MG PO TABS: 10.0000 mg | ORAL_TABLET | Freq: Three times a day (TID) | ORAL | 0 refills | Status: DC | PRN

## 2022-12-11 NOTE — ED Notes (Signed)
Pt discharged to home using teachback Method. Discharge instructions have been discussed with patient and/or family members. Pt verbally acknowledges understanding d/c instructions, has been given opportunity for questions to be answered, and endorses comprehension to checkout at registration before leaving.  

## 2022-12-11 NOTE — Transitions of Care (Post Inpatient/ED Visit) (Unsigned)
   12/11/2022  Name: Beverly Valentine MRN: [id number] DOB: [birth year]  Today's TOC FU Call Status: Today's TOC FU Call Status:: Unsuccessful Call (2nd Attempt) Unsuccessful Call (1st Attempt) Date: 12/09/22 Unsuccessful Call (2nd Attempt) Date: 12/11/22  Attempted to reach the patient regarding the most recent Inpatient/ED visit.  Follow Up Plan: Additional outreach attempts will be made to reach the patient to complete the Transitions of Care (Post Inpatient/ED visit) call.   Signature [place] Charon, Control and instrumentation engineer

## 2022-12-11 NOTE — ED Triage Notes (Signed)
Pt states she has a UTI and pain in her back from it is getting worse. Pt is taking her antibiotics for it.

## 2022-12-11 NOTE — Discharge Instructions (Addendum)
Urine culture pending.  Symptoms seem to be musculoskeletal in nature.  Rest of workup negative.  Make an appointment follow-up with your primary care provider.  Take the pain medication as directed.  Take the Flexeril as directed.

## 2022-12-11 NOTE — ED Provider Notes (Addendum)
[place] EMERGENCY DEPARTMENT AT [facility] Provider Note   CSN: [id number] Arrival date & time: 12/11/22  [id number]     History  Chief Complaint  Patient presents with   Back Pain    Beverly Valentine is a 52 y.o. female.  Patient here with a complaint of left back pain right below the shoulder blade.  Started somewhat yesterday.  States that this is similar to the pain that she was seen for on July 13 and July 20.  Patient on July 20 had CT angio chest without any acute findings.  There is evidence of a lung mass that they think is benign and recommended routine follow-up.  Also had CT renal study done July 13.  Patient has tried at home to take hydrocodone and gabapentin.  Patient did say there was a muscle relaxer she took at 1 point it seemed to really help.  Patient thinks that she had a urinary tract infection but her urine culture from the 13th was negative for any significant bacterial overgrowth.  Past medical history sniffing for hypertension and high cholesterol.  Patient's had an abdominal hysterectomy.  Patient is never used tobacco products.       Home Medications Prior to Admission medications   Medication Sig Start Date End Date Taking? Authorizing Provider  amLODipine (NORVASC) 10 MG tablet Take 1 tablet (10 mg total) by mouth daily. 12/05/22   Christen Butter, NP  cloNIDine (CATAPRES) 0.1 MG tablet Take 1 tablet (0.1 mg total) by mouth 2 (two) times daily. NEEDS APPOINTMENT FOR FURTHER REFILLS. 12/02/22   Christen Butter, NP  erythromycin ophthalmic ointment SMARTSIG:In Eye(s) 12/24/21   [provider]  fluticasone (FLONASE) 50 MCG/ACT nasal spray Place 2 sprays into both nostrils daily. 01/14/22   Achille Rich, PA-C  HYDROcodone-acetaminophen (NORCO) 5-325 MG tablet Take 1-2 tablets by mouth every 6 (six) hours as needed. 12/01/22   Geoffery Lyons, MD  lisinopril (ZESTRIL) 40 MG tablet Take 1 tablet (40 mg total) by mouth daily. NEEDS APPOINTMENT FOR FURTHER  REFILLS. 12/05/22   Christen Butter, NP  loratadine (CLARITIN) 10 MG tablet Take 1 tablet (10 mg total) by mouth daily. 01/14/22   Achille Rich, PA-C  metoprolol succinate (TOPROL-XL) 50 MG 24 hr tablet TAKE 1 TABLET BY MOUTH ONCE DAILY WITH OR IMMEDIATELY FOLLOWING A MEAL 12/05/22   Christen Butter, NP  rosuvastatin (CRESTOR) 20 MG tablet Take 1 tablet (20 mg total) by mouth daily. 12/05/22   Christen Butter, NP  traZODone (DESYREL) 50 MG tablet TAKE 1/2 (ONE-HALF) TO 1 TABLET BY MOUTH AT BEDTIME AS NEEDED FOR SLEEP . APPOINTMENT REQUIRED FOR FUTURE REFILLS 11/29/22   Christen Butter, NP      Allergies    Patient has no known allergies.    Review of Systems   Review of Systems  Constitutional:  Negative for chills and fever.  HENT:  Negative for ear pain and sore throat.   Eyes:  Negative for pain and visual disturbance.  Respiratory:  Negative for cough and shortness of breath.   Cardiovascular:  Negative for chest pain and palpitations.  Gastrointestinal:  Negative for abdominal pain and vomiting.  Genitourinary:  Negative for dysuria and hematuria.  Musculoskeletal:  Positive for back pain. Negative for arthralgias.  Skin:  Negative for color change and rash.  Neurological:  Negative for seizures and syncope.  All other systems reviewed and are negative.   Physical Exam Updated Vital Signs BP (!) 188/100 (BP Location: Right Arm)  Pulse 96   Temp 98.4 F (36.9 C) (Oral)   Resp 18   SpO2 98%  Physical Exam Vitals and nursing note reviewed.  Constitutional:      General: She is not in acute distress.    Appearance: She is well-developed. She is ill-appearing.  HENT:     Head: Normocephalic and atraumatic.  Eyes:     Extraocular Movements: Extraocular movements intact.     Conjunctiva/sclera: Conjunctivae normal.     Pupils: Pupils are equal, round, and reactive to light.  Cardiovascular:     Rate and Rhythm: Normal rate and regular rhythm.     Heart sounds: No murmur heard. Pulmonary:      Effort: Pulmonary effort is normal. No respiratory distress.     Breath sounds: Normal breath sounds.  Abdominal:     Palpations: Abdomen is soft.     Tenderness: There is no abdominal tenderness.  Musculoskeletal:        General: Tenderness present. No swelling.     Cervical back: Neck supple.     Comments: Tenderness to palpation just below the tip of the scapula on the left side.  Skin:    General: Skin is warm and dry.     Capillary Refill: Capillary refill takes less than 2 seconds.  Neurological:     General: No focal deficit present.     Mental Status: She is alert and oriented to person, place, and time.  Psychiatric:        Mood and Affect: Mood normal.     ED Results / Procedures / Treatments   Labs (all labs ordered are listed, but only abnormal results are displayed) Labs Reviewed  URINALYSIS, ROUTINE W REFLEX MICROSCOPIC    EKG None  Radiology No results found.  Procedures Procedures    Medications Ordered in ED Medications - No data to display  ED Course/ Medical Decision Making/ A&P                             Medical Decision Making Amount and/or Complexity of Data Reviewed Labs: ordered. Radiology: ordered.  Risk Prescription drug management.   Seems to be a musculoskeletal type pain.  Will get a chest x-ray just to rule out pneumothorax.  Also will get labs to make sure this is not an atypical cardiac presentation.  Will check urinalysis again.  Will treat with pain medicine.  Workup without any acute findings.  Troponins x 2 negative.  Urinalysis positive nitrite but does not have significant red blood cells or white blood cells or bacteria.  Will just send culture.  Will not treat.  CBC no leukocytosis hemoglobin 15.5.  Complete metabolic panel including liver function test negative lipase normal.  CT scan abdomen and pelvis done again without any acute findings.  There is a simple cyst in the kidney.  Nothing that requires  follow-up.  Again suspect that this may be musculoskeletal in nature.  Will have patient follow-up with primary care provider.   Final Clinical Impression(s) / ED Diagnoses Final diagnoses:  Acute left-sided thoracic back pain    Rx / DC Orders ED Discharge Orders     None         Vanetta Mulders, MD 12/11/22 [id number]    Vanetta Mulders, MD 12/11/22 1119

## 2022-12-12 ENCOUNTER — Telehealth: Payer: Self-pay | Admitting: General Practice

## 2022-12-12 ENCOUNTER — Ambulatory Visit (INDEPENDENT_AMBULATORY_CARE_PROVIDER_SITE_OTHER): Payer: BLUE CROSS/BLUE SHIELD | Admitting: Medical-Surgical

## 2022-12-12 VITALS — BP 146/80 | HR 63 | Resp 20 | Ht 66.0 in | Wt 217.8 lb

## 2022-12-12 DIAGNOSIS — M546 Pain in thoracic spine: Secondary | ICD-10-CM

## 2022-12-12 DIAGNOSIS — Z09 Encounter for follow-up examination after completed treatment for conditions other than malignant neoplasm: Secondary | ICD-10-CM | POA: Diagnosis not present

## 2022-12-12 DIAGNOSIS — I10 Essential (primary) hypertension: Secondary | ICD-10-CM

## 2022-12-12 MED ORDER — PREDNISONE 50 MG PO TABS: 50.0000 mg | ORAL_TABLET | Freq: Every day | ORAL | 0 refills | Status: DC

## 2022-12-12 NOTE — Progress Notes (Signed)
        Established patient visit  History, exam, impression, and plan:  1. Hospital discharge follow-up Pleasant 52 year old female presenting today for hospital discharge follow-up.  She has been seen in the emergency room on 7/13 for left flank pain, 7/20 shortness of breath, and 7/24 for acute left-sided thoracic pain.  Reports that the left flank pain has resolved.  Shortness of breath she experienced on 7/20 was evaluated thoroughly with no abnormal findings.  After discussion, feel this was anxiety mediated as her symptoms were worse when she gets stressed out with her grandkids.  Today denies any further shortness of breath.  When she went to the ED yesterday, she had developed acute left-sided thoracic pain in a very specific spot along the left ribs.  She had imaging completed only prescribed Flexeril and Norco for management.    2. Essential hypertension Blood pressure elevated today however she is in quite a bit of pain.  Plan to continue current medications and monitor blood pressure at home.  If continued elevation despite pain control, plan for further medication adjustment.  3. Acute left-sided thoracic back pain As noted above, she was seen at the ED for this.  She has tried taking the Flexeril and Norco but has had minimal relief of her symptoms.  Having significant issues with range of motion and typical activities.  Reports that she thinks this may have happened when she was at church and she was assisting someone to the floor when they passed out.  Has been sleeping on a heating pad all night.  Also notes that her husband rubbed some pain cream on it.  On evaluation, pinpoint tenderness noted along the bra line at the lateral thoracic spine.  Suspect a strain versus small tear in the area.  Advised that she can take 2 of the Norco tablets every 6 hours as needed.  Adding prednisone 50 mg daily x 5 days.  Continue Flexeril 10 mg every 8 hours as needed reviewed recommendations to  limit use of heating pad for no more than 20 minutes at a time.  Reviewed other conservative measures including lidocaine patches, massage, ice, gentle stretching, anti-inflammatories, etc.   Procedures performed this visit: None.  Return if symptoms worsen or fail to improve.  __________________________________ Thayer Ohm, DNP, APRN, FNP-BC Primary Care and Sports Medicine [facility] [place]

## 2022-12-12 NOTE — Transitions of Care (Post Inpatient/ED Visit) (Signed)
12/12/2022  Name: Beverly Valentine MRN: [id number] DOB: 06/01/1970  Today's TOC FU Call Status: Today's TOC FU Call Status:: Successful TOC FU Call Competed TOC FU Call Complete Date: 12/12/22  Transition Care Management Follow-up Telephone Call Date of Discharge: 12/11/22 Discharge Facility: [facility] Type of Discharge: Emergency Department Reason for ED Visit: Orthopedic Conditions Orthopedic/Injury Diagnosis:  (back pain) How have you been since you were released from the hospital?: Better Any questions or concerns?: No  Items Reviewed: Did you receive and understand the discharge instructions provided?: Yes Medications obtained,verified, and reconciled?: Yes (Medications Reviewed) Any new allergies since your discharge?: No Dietary orders reviewed?: NA Do you have support at home?: Yes  Medications Reviewed Today: Medications Reviewed Today     Reviewed by Modesto Charon, RN (Registered Nurse) on 12/12/22 at [phone]  Med List Status: <None>   Medication Order Taking? Sig Documenting Provider Last Dose Status Informant  amLODipine (NORVASC) 10 MG tablet [id number]  Take 1 tablet (10 mg total) by mouth daily. Christen Butter, NP  Active   cloNIDine (CATAPRES) 0.1 MG tablet [id number] No Take 1 tablet (0.1 mg total) by mouth 2 (two) times daily. NEEDS APPOINTMENT FOR FURTHER REFILLS. Christen Butter, NP Taking Active   cyclobenzaprine (FLEXERIL) 10 MG tablet [id number]  Take 1 tablet (10 mg total) by mouth 3 (three) times daily as needed for muscle spasms. Vanetta Mulders, MD  Active   erythromycin ophthalmic ointment [id number] No SMARTSIG:In Eye(s) [provider] Taking Active   fluticasone (FLONASE) 50 MCG/ACT nasal spray [id number] No Place 2 sprays into both nostrils daily. Achille Rich, PA-C Taking Active   HYDROcodone-acetaminophen (NORCO) 5-325 MG tablet [id number] No Take 1-2 tablets by mouth every 6 (six) hours as needed. Geoffery Lyons, MD Taking Active    HYDROcodone-acetaminophen (NORCO/VICODIN) 5-325 MG tablet [id number]  Take 1 tablet by mouth every 6 (six) hours as needed for moderate pain. Vanetta Mulders, MD  Active   lisinopril (ZESTRIL) 40 MG tablet [id number]  Take 1 tablet (40 mg total) by mouth daily. NEEDS APPOINTMENT FOR FURTHER REFILLS. Christen Butter, NP  Active   loratadine (CLARITIN) 10 MG tablet [id number] No Take 1 tablet (10 mg total) by mouth daily. Achille Rich, PA-C Taking Active   metoprolol succinate (TOPROL-XL) 50 MG 24 hr tablet [id number]  TAKE 1 TABLET BY MOUTH ONCE DAILY WITH OR IMMEDIATELY FOLLOWING A MEAL Jessup, Joy, NP  Active   rosuvastatin (CRESTOR) 20 MG tablet [id number]  Take 1 tablet (20 mg total) by mouth daily. Christen Butter, NP  Active   traZODone (DESYREL) 50 MG tablet [id number] No TAKE 1/2 (ONE-HALF) TO 1 TABLET BY MOUTH AT BEDTIME AS NEEDED FOR SLEEP . APPOINTMENT REQUIRED FOR FUTURE REFILLS Christen Butter, NP Taking Active             Home Care and Equipment/Supplies: Were Home Health Services Ordered?: NA Any new equipment or medical supplies ordered?: NA  Functional Questionnaire: Do you need assistance with bathing/showering or dressing?: No Do you need assistance with meal preparation?: No Do you need assistance with eating?: No Do you have difficulty maintaining continence: No Do you need assistance with getting out of bed/getting out of a chair/moving?: No Do you have difficulty managing or taking your medications?: No  Follow up appointments reviewed: PCP Follow-up appointment confirmed?: Yes Date of PCP follow-up appointment?: 12/12/22 Follow-up Provider: Dr. Christen Butter Specialist [facility] Follow-up appointment confirmed?: NA Do you need transportation to your follow-up appointment?: No Do you understand care options if  your condition(s) worsen?: Yes-patient verbalized understanding  SDOH Interventions Today    Flowsheet Row Most Recent Value  SDOH Interventions   Transportation  Interventions Intervention Not Indicated       SIGNATURE Modesto Charon, RN BSN Nurse Health Advisor

## 2022-12-12 NOTE — Transitions of Care (Post Inpatient/ED Visit) (Signed)
   12/12/2022  Name: Beverly Valentine MRN: [id number] DOB: 10-27-1970  Today's TOC FU Call Status: Today's TOC FU Call Status:: Unsuccessful Call (3rd Attempt) Unsuccessful Call (1st Attempt) Date: 12/09/22 Unsuccessful Call (2nd Attempt) Date: 12/11/22 Unsuccessful Call (3rd Attempt) Date: 12/12/22  Attempted to reach the patient regarding the most recent Inpatient/ED visit.  Follow Up Plan: No further outreach attempts will be made at this time. We have been unable to contact the patient.  Signature Modesto Charon, Control and instrumentation engineer

## 2022-12-17 ENCOUNTER — Other Ambulatory Visit: Payer: BLUE CROSS/BLUE SHIELD

## 2022-12-19 ENCOUNTER — Ambulatory Visit: Payer: BLUE CROSS/BLUE SHIELD

## 2022-12-20 ENCOUNTER — Telehealth: Payer: Self-pay | Admitting: Medical-Surgical

## 2022-12-20 NOTE — Telephone Encounter (Signed)
She has already received imaging of her chest to evaluate the lung mass.  No need to repeat this.

## 2022-12-20 NOTE — Telephone Encounter (Signed)
Patient called in wanting to know if she still needed to get CT scan. Please Advise.

## 2022-12-20 NOTE — Telephone Encounter (Signed)
Patient called stating that Imaging called her to schedule appt for Monday for CT scan. She want to know if she still needed to go. Please advise.

## 2022-12-30 ENCOUNTER — Ambulatory Visit (INDEPENDENT_AMBULATORY_CARE_PROVIDER_SITE_OTHER): Payer: BLUE CROSS/BLUE SHIELD | Admitting: Medical-Surgical

## 2022-12-30 VITALS — BP 141/72 | HR 90

## 2022-12-30 DIAGNOSIS — I10 Essential (primary) hypertension: Secondary | ICD-10-CM | POA: Diagnosis not present

## 2022-12-30 MED ORDER — LISINOPRIL 40 MG PO TABS
40.0000 mg | ORAL_TABLET | Freq: Every day | ORAL | 1 refills | Status: DC
Start: 2022-12-30 — End: 2023-06-05

## 2022-12-30 NOTE — Progress Notes (Signed)
   Established Patient Office Visit  Subjective   Patient ID: Beverly Valentine, female    DOB: January 01, 1971  Age: 52 y.o. MRN: [id number]  Chief Complaint  Patient presents with   Hypertension    HPI  Verle Pattan is here for blood pressure check. Denies chest pain, shortness of breath or dizziness.   ROS    Objective:     BP (!) 141/72   Pulse 90   SpO2 100%    Physical Exam   No results found for any visits on 12/30/22.    The 10-year ASCVD risk score (Arnett DK, et al., 2019) is: 5.1%    Assessment & Plan:  Blood pressure check -   Problem List Items Addressed This Visit       Unprioritized   Essential hypertension - Primary    Return in about 2 weeks (around 01/13/2023) for blood pressure check on the nurse visit. Earna Coder, Janalyn Harder, CMA

## 2022-12-30 NOTE — Progress Notes (Signed)
Blood pressure still not well-controlled.  Continue amlodipine and lisinopril as prescribed.  Recommend increasing Toprol-XL to 100 mg daily.  Return in 2 weeks for nurse visit for blood pressure check.  Medical screening examination/treatment was performed by qualified clinical staff member and as supervising provider I was immediately available for consultation/collaboration. I have reviewed documentation and agree with assessment and plan.  Thayer Ohm, DNP, APRN, FNP-BC [facility] and Sports Medicine

## 2022-12-31 NOTE — Progress Notes (Signed)
Patient advised of recommendations.  

## 2023-01-01 ENCOUNTER — Telehealth: Payer: Self-pay | Admitting: Medical-Surgical

## 2023-01-01 NOTE — Telephone Encounter (Signed)
Patient called in needed a refill on HYDROcodone-acetaminophen (NORCO/VICODIN) 5-325 MG tablet. Please advise  [id number] - [place], [place] - [address], [place] [address]

## 2023-01-01 NOTE — Telephone Encounter (Signed)
Thank you :)

## 2023-01-07 ENCOUNTER — Ambulatory Visit: Payer: BLUE CROSS/BLUE SHIELD | Admitting: Medical-Surgical

## 2023-01-13 ENCOUNTER — Ambulatory Visit: Payer: BLUE CROSS/BLUE SHIELD

## 2023-01-20 ENCOUNTER — Other Ambulatory Visit: Payer: Self-pay | Admitting: Medical-Surgical

## 2023-01-20 DIAGNOSIS — I10 Essential (primary) hypertension: Secondary | ICD-10-CM

## 2023-01-21 ENCOUNTER — Ambulatory Visit: Payer: BLUE CROSS/BLUE SHIELD

## 2023-01-21 ENCOUNTER — Other Ambulatory Visit: Payer: Self-pay

## 2023-01-21 DIAGNOSIS — I10 Essential (primary) hypertension: Secondary | ICD-10-CM

## 2023-01-21 MED ORDER — CLONIDINE HCL 0.1 MG PO TABS
0.1000 mg | ORAL_TABLET | Freq: Two times a day (BID) | ORAL | 1 refills | Status: DC
Start: 2023-01-21 — End: 2023-06-05

## 2023-01-21 MED ORDER — AMLODIPINE BESYLATE 10 MG PO TABS: 10.0000 mg | ORAL_TABLET | Freq: Every day | ORAL | 1 refills | Status: DC

## 2023-01-27 ENCOUNTER — Ambulatory Visit (INDEPENDENT_AMBULATORY_CARE_PROVIDER_SITE_OTHER): Payer: BLUE CROSS/BLUE SHIELD

## 2023-01-27 VITALS — BP 152/71 | HR 70

## 2023-01-27 DIAGNOSIS — I10 Essential (primary) hypertension: Secondary | ICD-10-CM

## 2023-01-27 NOTE — Progress Notes (Signed)
Patient had lower back pain. It was a 3. Patient states she was only taking 5 mg of amlodipine.Patient was informed that her amlodipine was 10 mg. and to start taking as prescribed. Patient made appointment for nurse visit in 2 weeks.

## 2023-02-10 ENCOUNTER — Ambulatory Visit: Payer: BLUE CROSS/BLUE SHIELD

## 2023-02-25 ENCOUNTER — Ambulatory Visit: Payer: BLUE CROSS/BLUE SHIELD

## 2023-03-05 ENCOUNTER — Ambulatory Visit (INDEPENDENT_AMBULATORY_CARE_PROVIDER_SITE_OTHER): Payer: BLUE CROSS/BLUE SHIELD

## 2023-03-05 VITALS — BP 138/73 | HR 64

## 2023-03-05 DIAGNOSIS — Z1211 Encounter for screening for malignant neoplasm of colon: Secondary | ICD-10-CM

## 2023-03-05 DIAGNOSIS — I10 Essential (primary) hypertension: Secondary | ICD-10-CM

## 2023-03-05 NOTE — Progress Notes (Signed)
Medical screening examination/treatment was performed by qualified clinical staff member and as supervising physician I was immediately available for consultation/collaboration. I have reviewed documentation and agree with assessment and plan.  Everrett Coombe, DO

## 2023-03-05 NOTE — Progress Notes (Signed)
Pt presents to clinic today for BP check.   Denies any cp/sob/palpitaions/headaches/dizziness.  She does report that when she was taking the 10 mg of Amlodipine she was experiencing some swelling in her ankles. She stated that she started taking 5mg  in the morning and 5 mg in the evening this helped to reduce the swelling that she was having.   Had pt to wait before rechecking bp it did go down.   After speaking to covering provider Dr. Ashley Royalty advised pt to continue taking the Amlodipine like she is doing and f/u with pcp in 3 mos for BP

## 2023-05-26 ENCOUNTER — Other Ambulatory Visit: Payer: Self-pay | Admitting: Medical-Surgical

## 2023-05-26 DIAGNOSIS — E7800 Pure hypercholesterolemia, unspecified: Secondary | ICD-10-CM

## 2023-06-05 ENCOUNTER — Ambulatory Visit (INDEPENDENT_AMBULATORY_CARE_PROVIDER_SITE_OTHER): Payer: BLUE CROSS/BLUE SHIELD | Admitting: Medical-Surgical

## 2023-06-05 ENCOUNTER — Encounter: Payer: Self-pay | Admitting: Medical-Surgical

## 2023-06-05 VITALS — BP 126/79 | HR 71 | Resp 20 | Ht 66.0 in | Wt 224.0 lb

## 2023-06-05 DIAGNOSIS — E7800 Pure hypercholesterolemia, unspecified: Secondary | ICD-10-CM | POA: Diagnosis not present

## 2023-06-05 DIAGNOSIS — I10 Essential (primary) hypertension: Secondary | ICD-10-CM | POA: Diagnosis not present

## 2023-06-05 DIAGNOSIS — Z6836 Body mass index (BMI) 36.0-36.9, adult: Secondary | ICD-10-CM

## 2023-06-05 DIAGNOSIS — E66812 Obesity, class 2: Secondary | ICD-10-CM | POA: Diagnosis not present

## 2023-06-05 MED ORDER — LISINOPRIL 40 MG PO TABS
40.0000 mg | ORAL_TABLET | Freq: Every day | ORAL | 3 refills | Status: AC
Start: 2023-06-05 — End: ?

## 2023-06-05 MED ORDER — TRAZODONE HCL 50 MG PO TABS: 50.0000 mg | ORAL_TABLET | Freq: Every evening | ORAL | 3 refills | Status: AC | PRN

## 2023-06-05 MED ORDER — ROSUVASTATIN CALCIUM 20 MG PO TABS
20.0000 mg | ORAL_TABLET | Freq: Every day | ORAL | 3 refills | Status: AC
Start: 2023-06-05 — End: ?

## 2023-06-05 MED ORDER — METOPROLOL SUCCINATE ER 50 MG PO TB24: ORAL_TABLET | ORAL | 3 refills | Status: DC

## 2023-06-05 MED ORDER — FINASTERIDE 5 MG PO TABS: 2.5000 mg | ORAL_TABLET | Freq: Every day | ORAL | 3 refills | Status: AC

## 2023-06-05 MED ORDER — AMLODIPINE BESYLATE 10 MG PO TABS: 10.0000 mg | ORAL_TABLET | Freq: Every day | ORAL | 1 refills | Status: DC

## 2023-06-05 MED ORDER — CLONIDINE HCL 0.1 MG PO TABS
0.1000 mg | ORAL_TABLET | Freq: Two times a day (BID) | ORAL | 3 refills | Status: AC
Start: 2023-06-05 — End: ?

## 2023-06-05 NOTE — Progress Notes (Signed)
        Established patient visit  History, exam, impression, and plan:  1. Essential hypertension Pleasant 53 year old female presenting today with a history of hypertension that has recently been difficult to manage.  She has bee following her regimen of amlodipine 10 mg daily, clonidine 0.1 mg twice daily, lisinopril 40 mg daily, and Toprol XL 50 mg daily.  Tolerating all medications well without side effects.  Checking blood pressures at home.  Mostly following a low-sodium diet but admits that this is hard at times.  Not exercising as much as she should but does get some activity in every now and then.  Denies any concerning symptoms today.  Cardiopulmonary exam is normal.  Checking labs as below.  Blood pressure is at goal.  Continue amlodipine, clonidine, lisinopril, and Toprol-XL as prescribed. - cloNIDine (CATAPRES) 0.1 MG tablet; Take 1 tablet (0.1 mg total) by mouth 2 (two) times daily.  Dispense: 180 tablet; Refill: 3 - lisinopril (ZESTRIL) 40 MG tablet; Take 1 tablet (40 mg total) by mouth daily.  Dispense: 90 tablet; Refill: 3 - CBC with Differential/Platelet - CMP14+EGFR - Lipid panel  2. Hypercholesterolemia She is taking rosuvastatin 20 mg daily, tolerating well without side effects.  Working on dietary modification.  Aware to increase physical exercise with a goal for weight loss to healthy weight.  Checking lipids.  Continue Crestor as prescribed. - rosuvastatin (CRESTOR) 20 MG tablet; Take 1 tablet (20 mg total) by mouth daily.  Dispense: 90 tablet; Refill: 3 - Lipid panel  3. Class 2 severe obesity due to excess calories with serious comorbidity and body mass index (BMI) of 36.0 to 36.9 in adult [facility]) (Primary) As noted above, she is not getting as much exercise as she should but is trying to incorporate this whenever possible.  Making efforts for dietary changes however she tends to "fall off the wagon" after 2 weeks.  Discussed making small changes at the time and working to  be more consistent with exercise.  Patient verbalized understanding is agreeable to the plan.   Procedures performed this visit: None.  Return in about 6 months (around 12/03/2023) for HTN/HLD follow up.  __________________________________ Thayer Ohm, DNP, APRN, FNP-BC Primary Care and Sports Medicine [facility] [place]

## 2023-06-06 ENCOUNTER — Encounter: Payer: Self-pay | Admitting: Medical-Surgical

## 2023-06-06 LAB — CMP14+EGFR
ALT: 24 [IU]/L (ref 0–32)
AST: 24 [IU]/L (ref 0–40)
Albumin: 4.4 g/dL (ref 3.8–4.9)
Alkaline Phosphatase: 117 [IU]/L (ref 44–121)
BUN/Creatinine Ratio: 20 (ref 9–23)
BUN: 18 mg/dL (ref 6–24)
Bilirubin Total: 0.3 mg/dL (ref 0.0–1.2)
CO2: 24 mmol/L (ref 20–29)
Calcium: 10.5 mg/dL — ABNORMAL HIGH (ref 8.7–10.2)
Chloride: 103 mmol/L (ref 96–106)
Creatinine, Ser: 0.92 mg/dL (ref 0.57–1.00)
Globulin, Total: 2.4 g/dL (ref 1.5–4.5)
Glucose: 89 mg/dL (ref 70–99)
Potassium: 4.3 mmol/L (ref 3.5–5.2)
Sodium: 141 mmol/L (ref 134–144)
Total Protein: 6.8 g/dL (ref 6.0–8.5)
eGFR: 74 mL/min/{1.73_m2} (ref >59)

## 2023-06-06 LAB — CBC WITH DIFFERENTIAL/PLATELET
Basophils Absolute: 0.1 10*3/uL (ref 0.0–0.2)
Basos: 1 %
EOS (ABSOLUTE): 0.1 10*3/uL (ref 0.0–0.4)
Eos: 2 %
Hematocrit: 42.8 % (ref 34.0–46.6)
Hemoglobin: 14.2 g/dL (ref 11.1–15.9)
Immature Grans (Abs): 0 10*3/uL (ref 0.0–0.1)
Immature Granulocytes: 0 %
Lymphocytes Absolute: 2.4 10*3/uL (ref 0.7–3.1)
Lymphs: 40 %
MCH: 28.4 pg (ref 26.6–33.0)
MCHC: 33.2 g/dL (ref 31.5–35.7)
MCV: 86 fL (ref 79–97)
Monocytes Absolute: 0.7 10*3/uL (ref 0.1–0.9)
Monocytes: 12 %
Neutrophils Absolute: 2.7 10*3/uL (ref 1.4–7.0)
Neutrophils: 45 %
Platelets: 277 10*3/uL (ref 150–450)
RBC: 5 x10E6/uL (ref 3.77–5.28)
RDW: 13 % (ref 11.7–15.4)
WBC: 6 10*3/uL (ref 3.4–10.8)

## 2023-06-06 LAB — LIPID PANEL
Chol/HDL Ratio: 2.6 {ratio} (ref 0.0–4.4)
Cholesterol, Total: 152 mg/dL (ref 100–199)
HDL: 58 mg/dL (ref >39)
LDL Chol Calc (NIH): 78 mg/dL (ref 0–99)
Triglycerides: 87 mg/dL (ref 0–149)
VLDL Cholesterol Cal: 16 mg/dL (ref 5–40)

## 2023-06-06 LAB — SPECIMEN STATUS REPORT

## 2023-07-22 ENCOUNTER — Telehealth: Payer: Self-pay | Admitting: Medical-Surgical

## 2023-07-22 NOTE — Telephone Encounter (Signed)
 Copied from CRM [id number]. Topic: Clinical - Prescription Issue >> Jul 22, 2023  9:46 AM Louie Casa B wrote: Reason for CRM: patient wants to stop taking the lisinopril (ZESTRIL) 40 MG tablet and start something new [id number]

## 2023-07-23 NOTE — Telephone Encounter (Signed)
 Patient schld for march 18th at 8:50 am

## 2023-08-05 ENCOUNTER — Ambulatory Visit: Admitting: Medical-Surgical

## 2023-08-15 ENCOUNTER — Encounter: Payer: Self-pay | Admitting: Medical-Surgical

## 2023-08-15 ENCOUNTER — Ambulatory Visit (INDEPENDENT_AMBULATORY_CARE_PROVIDER_SITE_OTHER): Admitting: Medical-Surgical

## 2023-08-15 VITALS — BP 137/75 | HR 73 | Resp 20 | Ht 66.0 in | Wt 223.4 lb

## 2023-08-15 DIAGNOSIS — I10 Essential (primary) hypertension: Secondary | ICD-10-CM

## 2023-08-15 DIAGNOSIS — M722 Plantar fascial fibromatosis: Secondary | ICD-10-CM | POA: Insufficient documentation

## 2023-08-15 NOTE — Progress Notes (Signed)
 Established patient visit  History, exam, impression, and plan:  1. Essential hypertension (Primary) Pleasant 53 year old female presenting today with a long history of hypertension that has been difficult to manage.  She is currently on amlodipine, clonidine, Toprol-XL, and lisinopril.  Has been prescribed amlodipine 10 mg daily but reports that she was told several months ago that she should be taking this twice daily.  She has been taking it twice daily as instructed.  Tolerated well without side effects and was unaware of that this was not the correct instructions.  She is taking lisinopril 40 mg daily and has been doing some research on the medication.  She has heard things about the risk for damage to kidneys and would like to switch the medication to something else.  Taking Toprol-XL 50 mg daily, tolerating well.  Using clonidine 0.1 mg twice daily.  Has a cuff at home but has not been monitoring blood pressure.  She is trying to lose weight but has room for improvement and dietary and exercise habits. Denies CP, SOB, palpitations, lower extremity edema, dizziness, headaches, or vision changes.  Cardiopulmonary exam is normal today.  In-depth discussion conducted regarding her medications.  On chart review, she had been taking amlodipine 5 mg daily and this was increased to 10 mg daily.  She misunderstood that as take her pill twice daily rather than just increase to 10 mg once daily.  Clarify these instructions today and she will reduce to 10 mg once daily.  Discussed lisinopril and potential side effects.  Her kidney function is absolutely normal and there are no current concerns for risk.  Reviewed the risk to kidneys with acute kidney injury, CKD, dehydration from acute illness, etc.  As she has no current risk factors for any of these, she has agreed to continue lisinopril 40 mg daily.  Continue clonidine as prescribed.  She is very interested in herbal options and supplements to help her  blood pressure so she can get off some of these medications.  Information provided with AVS on lifestyle measures and supplements that can help with blood pressure management.  Advised that supplements will not to take the place of her prescribed medications but they may be of benefit.  Encouraged weight loss and regular intentional exercise including both cardiovascular and strength training activities.  Patient verbalized understanding.  Of note, she is at high risk for sleep apnea.  Has never had any testing for sleep apnea and is amenable today.  His home sleep study ordered.  See below for STOP-BANG criteria met. - Home sleep test; Future  2. Plantar fasciitis of left foot Notes that she has significant pain in her left heel on first rising in the morning and after sitting for a while then standing.  This has been going on for several weeks and she has not found anything to help.  Symptoms consistent with plantar fasciitis.  Home exercises printed and provided with AVS.  Recommend Aleve 1 tablet twice daily for the next 2 weeks.  Heel lifts provided to place in shoes.  Conservative measures discussed including tennis ball exercises and icing.  If no improvement in 4-6 weeks, return for further evaluation and management.  STOP-BANG for SLEEP APNEA Do you Snore loudly? Yes Do you often feel Tired during day? No Has anyone Observed you stop breathing? No History of high blood Pressure? Yes BMI >35? Yes Age >50? Yes Neck circumference >16 in? Yes Gender female? No 5-8 =  high risk 3-4 = intermediate 0-2 = low risk    Procedures performed this visit: None.  Return in about 2 weeks (around 08/29/2023) for nurse visit for BP check.  __________________________________ Thayer Ohm, DNP, APRN, FNP-BC Primary Care and Sports Medicine [facility] [place]

## 2023-08-15 NOTE — Patient Instructions (Signed)
Holistic management of heart disease:  Avoid:  Psychological stress High blood sugar Very high LDL (bad cholesterol) Estrogen Smoking  Obesity  Promote: Regular exercise Fish oil (most effective if directly from eating fish) Eating garlic Use olive oil Adequate folic acid intake Healthy weight management Control blood pressure Avoid pesticides  Supplements and vitamins that MAY be of benefit to manage heart disease and high blood pressure:    **Keep in mind that supplements, herbs, and vitamins are NOT regulated by the FDA and finding a reputable source for purchasing these is extremely important. There is very little data available to determine if these interventions are helpful or not but some patients have found them helpful. Please make sure to talk with your pharmacist or healthcare provider about possible interactions or contraindications prior to starting any of the following**  Garlic- helps reduce high cholesterol and blood pressure. Consumed raw is most effective.  Vitamin E- found in unrefined vegetable oils, whole grains, butter, organ meats, eggs, sunflower seeds, fruit, soybeans, and dark green leafy vegetables. Can also be found in most multivitamins or as a separate supplement.  Gingko Biloba- increases good cholesterol and blood flow to the heart, decreases bad cholesterol. Glutathione- neutralizes free radicals to prevent cell damage. Binds toxic substances in the liver. Make sure to have adequate intake of Vitamins C and E to help this work! Green tea- lowers bad cholesterol and triglycerides, increases good cholesterol. PCOs- found in grapeseed, lemon tree bark, peanuts, cranberries, and citrus peels. Strengthens blood vessel walls, increases circulation, and helps increase the performance of other antioxidants.  Co-enzyme Q10- decreases pain associated with angina. Ok to use if taking a statin medication for cholesterol. Helps with periodontal disease.  [place]  berries- helps increase circulation and decrease blood pressure. Also helpful for angina pain.  Carnitine- most useful with angina pain and congestive heart failure (talk with your cardiologist before considering this).   Recommended vitamins to lower blood pressure:  Vitamin C 1,000-2,000mg  daily Vitamin E 400 IU daily Magnesium 500-800mg  daily Calcium up to 500mg  daily Zinc 10mg  daily Co-enzyme Q10 (CoQ10) 30-90mg  daily  Other herbs that may be beneficial  [employer] (diuretic) [place]ai Ginseng

## 2023-08-28 NOTE — Progress Notes (Deleted)
 Pt here to have BP checked for essential hypertension. Pt last reading in office was 137/75. Pt denies SOB, CP, headache, or medication changes.                             Pt first BP reading is

## 2023-08-29 ENCOUNTER — Ambulatory Visit

## 2023-12-04 ENCOUNTER — Ambulatory Visit: Payer: BLUE CROSS/BLUE SHIELD | Admitting: Medical-Surgical

## 2023-12-04 NOTE — Progress Notes (Deleted)
 Established patient visit  Discussed the use of AI scribe software for clinical note transcription with the patient, who gave verbal consent to proceed.  History of Present Illness   The patient presents with dizziness and ear discomfort.  Dizziness - Dizziness occurs with quick movements or when standing up quickly - Notable episode lasted 2-3 minutes at a thrift store - Eating alleviates dizziness - Suspects possible association with blood pressure medication, low blood sugar, or left ear symptoms  Left ear discomfort - Left ear discomfort suspected to be due to water retention or infection - Uses ear plugs during showers - Symptoms worsen with wind exposure - Sensitivity in the ear canal - No recent use of ear drops  Antihypertensive medication adherence - Takes losartan  for blood pressure at noon but occasionally forgets doses  Glycemic monitoring and supplement use - Monitors blood sugar levels at home - Most recent hemoglobin A1c is 6.3 - Takes berberine but had a recent lapse in use     Physical Exam Vitals reviewed.  Constitutional:      General: She is not in acute distress.    Appearance: Normal appearance.  HENT:     Head: Normocephalic and atraumatic.  Cardiovascular:     Rate and Rhythm: Normal rate and regular rhythm.     Pulses: Normal pulses.     Heart sounds: Normal heart sounds. No murmur heard.    No friction rub. No gallop.  Pulmonary:     Effort: Pulmonary effort is normal. No respiratory distress.     Breath sounds: Normal breath sounds. No wheezing.  Skin:    General: Skin is warm and dry.  Neurological:     Mental Status: She is alert and oriented to person, place, and time.  Psychiatric:        Mood and Affect: Mood normal.        Behavior: Behavior normal.        Thought Content: Thought content normal.        Judgment: Judgment normal.     Assessment and Plan    Dizziness Intermittent dizziness possibly linked to losartan ,  dehydration, or ear issues. Positional changes and balance disturbances noted. Blood pressure controlled at 110/66 mmHg. Symptoms improved post-eating, indicating possible hypoglycemia. - Ensure adequate hydration, especially in hot weather. - Monitor blood pressure regularly at home. - Reduce losartan  dose to half a tablet daily for two weeks and monitor blood pressure response. - Schedule nurse visit in two weeks to verify home blood pressure cuff accuracy and assess blood pressure control on reduced losartan  dose.  Otitis Externa Red, irritated ear canals suggest otitis externa. Dizziness with head movement and sensitivity to wind noted. Water retention post-shower may contribute to irritation. - Prescribe Ciprodex  ear drops, 4 drops in each ear twice daily for 7 days. - Advise use of ear plugs during showers to prevent water retention. - Instruct to monitor symptoms and complete full course of ear drops unless symptoms resolve by day 5. - Fill prescription at [facility] for affordability.  Hypertension Hypertension well-controlled on losartan  with blood pressure at 110/66 mmHg. Dizziness may relate to medication or other factors. Discussed dose reduction to assess response. - Reduce losartan  dose to half a tablet daily for two weeks and monitor blood pressure response. - Schedule nurse visit in two weeks to verify home blood pressure cuff accuracy and assess blood pressure control on reduced losartan  dose. - Refill losartan  prescription for three months  at [facility] pharmacy.  Type 2 Diabetes Mellitus Type 2 diabetes with A1c at 6.3%. Inconsistent berberine use and glucose meter issues noted. Overall control stable despite dietary indiscretions. - Encourage consistent use of berberine as previously prescribed. - Advise obtaining a new glucose meter to resume regular blood sugar monitoring.  General Health Maintenance Discussed hydration, dietary moderation, and regular monitoring of blood  pressure and blood sugar for overall health. - Encourage adequate hydration, especially in hot weather. - Advise on dietary moderation, including occasional indulgence in foods like white potatoes, while maintaining overall healthy eating habits.     Return in about 2 weeks (around 12/16/2023) for nurse visit for BP check.  __________________________________ Zada FREDRIK Palin, DNP, APRN, FNP-BC Primary Care and Sports Medicine [facility] [place]

## 2023-12-05 ENCOUNTER — Ambulatory Visit: Admitting: Medical-Surgical

## 2023-12-15 ENCOUNTER — Ambulatory Visit (INDEPENDENT_AMBULATORY_CARE_PROVIDER_SITE_OTHER): Payer: PRIVATE HEALTH INSURANCE | Admitting: Medical-Surgical

## 2023-12-15 VITALS — BP 146/89 | HR 67 | Resp 20 | Ht 66.0 in | Wt 227.0 lb

## 2023-12-15 DIAGNOSIS — Z1211 Encounter for screening for malignant neoplasm of colon: Secondary | ICD-10-CM | POA: Diagnosis not present

## 2023-12-15 DIAGNOSIS — E7800 Pure hypercholesterolemia, unspecified: Secondary | ICD-10-CM | POA: Diagnosis not present

## 2023-12-15 DIAGNOSIS — I10 Essential (primary) hypertension: Secondary | ICD-10-CM

## 2023-12-15 DIAGNOSIS — L659 Nonscarring hair loss, unspecified: Secondary | ICD-10-CM

## 2023-12-15 MED ORDER — HYDROCHLOROTHIAZIDE 25 MG PO TABS: 25.0000 mg | ORAL_TABLET | Freq: Every day | ORAL | 1 refills | Status: DC

## 2023-12-15 NOTE — Progress Notes (Signed)
        Established patient visit   History of Present Illness   Discussed the use of AI scribe software for clinical note transcription with the patient, who gave verbal consent to proceed.  History of Present Illness   Beverly Valentine is a 53 year old female with hypertension who presents for follow-up on elevated blood pressure.  Her blood pressure remains elevated despite adherence to amlodipine , clonidine , lisinopril , and metoprolol . Home blood pressure readings align with office measurements.  She was using finasteride  for hair loss but did not get her refill from the pharmacy. Would like to resume the medication.    History of trouble sleeping. Trazodone  ineffective. Using an off-brand OTC sleep aid that works well.   She has not been taking Crestor  for cholesterol management reporting she did not  know that she had refills.   She had a previous CT scan for renal stone studies but has not had an ultrasound for renal artery stenosis. Insurance issues have prevented a sleep study in the past.      Physical Exam   Physical Exam Vitals reviewed.  Constitutional:      General: She is not in acute distress.    Appearance: Normal appearance. She is not ill-appearing.  HENT:     Head: Normocephalic and atraumatic.  Cardiovascular:     Rate and Rhythm: Normal rate and regular rhythm.     Pulses: Normal pulses.     Heart sounds: Normal heart sounds. No murmur heard.    No friction rub. No gallop.  Pulmonary:     Effort: Pulmonary effort is normal. No respiratory distress.     Breath sounds: Normal breath sounds. No wheezing.  Skin:    General: Skin is warm and dry.  Neurological:     Mental Status: She is alert and oriented to person, place, and time.  Psychiatric:        Mood and Affect: Mood normal.        Behavior: Behavior normal.        Thought Content: Thought content normal.        Judgment: Judgment normal.     Assessment & Plan   Assessment and Plan     Hypertension Hypertension remains uncontrolled despite current regimen. Secondary causes considered due to polypharmacy.  - Add hydrochlorothiazide  to current regimen. - Order renal artery ultrasound. - Reorder home sleep study. - Schedule follow-up in two weeks for blood pressure check.  Hyperlipidemia Non-adherence to rosuvastatin  noted. Importance of cholesterol management discussed. - Refill rosuvastatin  20 mg daily.  Alopecia Desires to resume finasteride  for hair loss. - Refill finasteride  5 mg tablets with instructions to take half a tablet daily.  General Health Maintenance Due for colon cancer screening. Interested in [employer]. - Order Cologuard for colon cancer screening.     Follow up   Return in about 2 weeks (around 12/29/2023) for nurse visit for BP check.  __________________________________ Zada FREDRIK Palin, DNP, APRN, FNP-BC Primary Care and Sports Medicine [facility] [place]

## 2023-12-15 NOTE — Patient Instructions (Signed)
 Today, we reviewed your elevated blood pressure, hair loss treatment, and cholesterol management. We also discussed general health maintenance, including colon cancer screening.  YOUR PLAN:  -HYPERTENSION: Hypertension means high blood pressure. Your blood pressure remains high despite your current medications. We are adding hydrochlorothiazide  to your regimen and ordering a renal artery ultrasound and a home sleep study to investigate further. Please follow up in two weeks for a blood pressure check.  -HYPERLIPIDEMIA: Hyperlipidemia means high cholesterol. You have not been taking your prescribed rosuvastatin . It is important to manage your cholesterol, so we have refilled your prescription for rosuvastatin  20 mg daily.  -ALOPECIA: Alopecia means hair loss. You wish to resume finasteride  for hair loss treatment. We have refilled your prescription for finasteride  5 mg tablets, and you should take half a tablet daily.  -GENERAL HEALTH MAINTENANCE: You are due for colon cancer screening. We have ordered a Cologuard test for you.  INSTRUCTIONS:  Please follow up in two weeks for a blood pressure check. Additionally, complete the renal artery ultrasound and home sleep study as ordered.

## 2023-12-16 NOTE — Addendum Note (Signed)
 Addended byBETHA WILLO MINI on: 12/16/2023 01:16 PM   Modules accepted: Orders

## 2023-12-16 NOTE — Addendum Note (Signed)
 Addended byBETHA WILLO MINI on: 12/16/2023 01:15 PM   Modules accepted: Orders

## 2023-12-24 ENCOUNTER — Ambulatory Visit: Payer: Self-pay | Admitting: Medical-Surgical

## 2023-12-24 ENCOUNTER — Ambulatory Visit (HOSPITAL_COMMUNITY)
Admission: RE | Admit: 2023-12-24 | Discharge: 2023-12-24 | Disposition: A | Discharge status: Home / Self Care | Payer: PRIVATE HEALTH INSURANCE | Source: Ambulatory Visit | Attending: Medical-Surgical | Admitting: Medical-Surgical

## 2023-12-24 DIAGNOSIS — I10 Essential (primary) hypertension: Secondary | ICD-10-CM | POA: Diagnosis present

## 2023-12-29 ENCOUNTER — Ambulatory Visit: Payer: PRIVATE HEALTH INSURANCE

## 2023-12-31 ENCOUNTER — Other Ambulatory Visit: Payer: Self-pay | Admitting: Medical-Surgical

## 2023-12-31 ENCOUNTER — Ambulatory Visit: Payer: PRIVATE HEALTH INSURANCE

## 2024-01-05 ENCOUNTER — Ambulatory Visit (INDEPENDENT_AMBULATORY_CARE_PROVIDER_SITE_OTHER): Payer: PRIVATE HEALTH INSURANCE

## 2024-01-05 VITALS — BP 105/59 | HR 68 | Ht 66.0 in

## 2024-01-05 DIAGNOSIS — I10 Essential (primary) hypertension: Secondary | ICD-10-CM

## 2024-01-05 NOTE — Progress Notes (Signed)
   Established Patient Office Visit  Subjective   Patient ID: Marzetta Lanza, female    DOB: 13-Nov-1970  Age: 53 y.o. MRN: [id number]  Chief Complaint  Patient presents with   Hypertension    BP check nurse visit     HPI  Hypertension- BP check nurse visit.  Pateint denies chest pain,dizziness, palpitation, vision changes, headaches or medication problems - but does state that she has had some shortness of breath x 1 week about one hour after taking BP medication on most days.   Patient has completed renal ultrasound but insurance would not cover the home sleep study.   ROS    Objective:     BP (!) 105/59   Pulse 68   Ht 5' 6 (1.676 m)   SpO2 99%   BMI 36.64 kg/m    Physical Exam   No results found for any visits on 01/05/24.    The 10-year ASCVD risk score (Arnett DK, et al., 2019) is: 1.5%    Assessment & Plan:  BP check - nurse visit.  Initial reading = 111/43. Secondary reading = 105/59.   Per Zada Palin, NP cut hydrochlorothiazide  in half ( take 12.5mg  daily )  - monitor BP at home - record once daily BP readings and bring BP log into next appt. Return in 2 weeks for nurse visit. . Problem List Items Addressed This Visit       Cardiovascular and Mediastinum   Essential hypertension - Primary    Return in about 2 weeks (around 01/19/2024) for BP check nurse visit.    Suzen SHAUNNA Plenty, LPN

## 2024-01-05 NOTE — Patient Instructions (Signed)
 Return in 2 weeks for nurse visit for BP check ==kph

## 2024-01-26 ENCOUNTER — Ambulatory Visit: Payer: PRIVATE HEALTH INSURANCE

## 2024-02-08 ENCOUNTER — Other Ambulatory Visit: Payer: Self-pay | Admitting: Medical-Surgical

## 2024-02-08 DIAGNOSIS — I10 Essential (primary) hypertension: Secondary | ICD-10-CM

## 2024-03-13 ENCOUNTER — Other Ambulatory Visit: Payer: Self-pay | Admitting: Medical-Surgical

## 2024-03-13 DIAGNOSIS — I10 Essential (primary) hypertension: Secondary | ICD-10-CM

## 2024-05-28 ENCOUNTER — Other Ambulatory Visit: Payer: Self-pay | Admitting: Medical-Surgical

## 2024-06-03 ENCOUNTER — Other Ambulatory Visit: Payer: Self-pay | Admitting: Medical-Surgical

## 2024-06-03 DIAGNOSIS — I10 Essential (primary) hypertension: Secondary | ICD-10-CM

## 2024-06-04 ENCOUNTER — Other Ambulatory Visit: Payer: Self-pay | Admitting: Medical-Surgical

## 2024-06-21 ENCOUNTER — Other Ambulatory Visit: Payer: Self-pay | Admitting: Medical-Surgical
